# Patient Record
Sex: Female | Born: 1995
Health system: Southern US, Community
[De-identification: ages and names within clinical notes are randomized; demographics above are authoritative.]

## PROBLEM LIST (undated history)

## (undated) DIAGNOSIS — J45909 Unspecified asthma, uncomplicated: Secondary | ICD-10-CM

## (undated) DIAGNOSIS — F329 Major depressive disorder, single episode, unspecified: Secondary | ICD-10-CM

## (undated) DIAGNOSIS — F32A Depression, unspecified: Secondary | ICD-10-CM

---
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
## 2015-02-07 ENCOUNTER — Other Ambulatory Visit (HOSPITAL_COMMUNITY)
Admission: RE | Admit: 2015-02-07 | Discharge: 2015-02-07 | Disposition: A | Discharge status: Home / Self Care | Payer: BLUE CROSS/BLUE SHIELD | Source: Ambulatory Visit | Attending: Family Medicine | Admitting: Family Medicine

## 2015-02-07 DIAGNOSIS — Z113 Encounter for screening for infections with a predominantly sexual mode of transmission: Secondary | ICD-10-CM | POA: Diagnosis present

## 2015-02-07 DIAGNOSIS — Z01411 Encounter for gynecological examination (general) (routine) with abnormal findings: Secondary | ICD-10-CM | POA: Insufficient documentation

## 2016-01-24 DIAGNOSIS — N3010 Interstitial cystitis (chronic) without hematuria: Secondary | ICD-10-CM | POA: Diagnosis not present

## 2016-01-24 DIAGNOSIS — Z01411 Encounter for gynecological examination (general) (routine) with abnormal findings: Secondary | ICD-10-CM | POA: Diagnosis not present

## 2016-01-24 DIAGNOSIS — Z113 Encounter for screening for infections with a predominantly sexual mode of transmission: Secondary | ICD-10-CM | POA: Diagnosis not present

## 2016-04-10 DIAGNOSIS — Z30014 Encounter for initial prescription of intrauterine contraceptive device: Secondary | ICD-10-CM | POA: Diagnosis not present

## 2016-04-10 DIAGNOSIS — N3010 Interstitial cystitis (chronic) without hematuria: Secondary | ICD-10-CM | POA: Diagnosis not present

## 2016-04-10 DIAGNOSIS — Z975 Presence of (intrauterine) contraceptive device: Secondary | ICD-10-CM | POA: Diagnosis not present

## 2016-04-16 DIAGNOSIS — Z3202 Encounter for pregnancy test, result negative: Secondary | ICD-10-CM | POA: Diagnosis not present

## 2016-04-16 DIAGNOSIS — Z3046 Encounter for surveillance of implantable subdermal contraceptive: Secondary | ICD-10-CM | POA: Diagnosis not present

## 2016-04-16 DIAGNOSIS — Z30430 Encounter for insertion of intrauterine contraceptive device: Secondary | ICD-10-CM | POA: Diagnosis not present

## 2016-05-22 DIAGNOSIS — Z30431 Encounter for routine checking of intrauterine contraceptive device: Secondary | ICD-10-CM | POA: Diagnosis not present

## 2016-05-22 DIAGNOSIS — N3010 Interstitial cystitis (chronic) without hematuria: Secondary | ICD-10-CM | POA: Diagnosis not present

## 2016-05-22 DIAGNOSIS — R300 Dysuria: Secondary | ICD-10-CM | POA: Diagnosis not present

## 2016-05-22 DIAGNOSIS — B009 Herpesviral infection, unspecified: Secondary | ICD-10-CM | POA: Diagnosis not present

## 2016-08-19 DIAGNOSIS — J069 Acute upper respiratory infection, unspecified: Secondary | ICD-10-CM | POA: Diagnosis not present

## 2016-08-19 DIAGNOSIS — J029 Acute pharyngitis, unspecified: Secondary | ICD-10-CM | POA: Diagnosis not present

## 2016-09-15 DIAGNOSIS — F329 Major depressive disorder, single episode, unspecified: Secondary | ICD-10-CM | POA: Diagnosis not present

## 2016-09-16 DIAGNOSIS — F329 Major depressive disorder, single episode, unspecified: Secondary | ICD-10-CM | POA: Diagnosis not present

## 2016-09-18 DIAGNOSIS — F321 Major depressive disorder, single episode, moderate: Secondary | ICD-10-CM | POA: Diagnosis not present

## 2016-09-18 DIAGNOSIS — F4310 Post-traumatic stress disorder, unspecified: Secondary | ICD-10-CM | POA: Diagnosis not present

## 2016-09-25 DIAGNOSIS — F329 Major depressive disorder, single episode, unspecified: Secondary | ICD-10-CM | POA: Diagnosis not present

## 2016-10-02 DIAGNOSIS — F4310 Post-traumatic stress disorder, unspecified: Secondary | ICD-10-CM | POA: Diagnosis not present

## 2016-10-02 DIAGNOSIS — F321 Major depressive disorder, single episode, moderate: Secondary | ICD-10-CM | POA: Diagnosis not present

## 2016-10-08 DIAGNOSIS — F329 Major depressive disorder, single episode, unspecified: Secondary | ICD-10-CM | POA: Diagnosis not present

## 2016-10-15 ENCOUNTER — Emergency Department (HOSPITAL_COMMUNITY)
Admission: EM | Admit: 2016-10-15 | Discharge: 2016-10-15 | Disposition: A | Discharge status: Other Acute Inpatient Hospital | Payer: BLUE CROSS/BLUE SHIELD | Attending: Physician Assistant | Admitting: Physician Assistant

## 2016-10-15 ENCOUNTER — Inpatient Hospital Stay (HOSPITAL_COMMUNITY)
Admission: AD | Admit: 2016-10-15 | Discharge: 2016-10-18 | DRG: 885 | Disposition: A | Discharge status: Home / Self Care | Payer: BLUE CROSS/BLUE SHIELD | Source: Intra-hospital | Attending: Psychiatry | Admitting: Psychiatry

## 2016-10-15 ENCOUNTER — Encounter (HOSPITAL_COMMUNITY): Payer: Self-pay | Admitting: Emergency Medicine

## 2016-10-15 DIAGNOSIS — Z5181 Encounter for therapeutic drug level monitoring: Secondary | ICD-10-CM | POA: Insufficient documentation

## 2016-10-15 DIAGNOSIS — F411 Generalized anxiety disorder: Secondary | ICD-10-CM | POA: Diagnosis present

## 2016-10-15 DIAGNOSIS — G4700 Insomnia, unspecified: Secondary | ICD-10-CM | POA: Diagnosis not present

## 2016-10-15 DIAGNOSIS — T1491XA Suicide attempt, initial encounter: Secondary | ICD-10-CM | POA: Diagnosis not present

## 2016-10-15 DIAGNOSIS — Z91410 Personal history of adult physical and sexual abuse: Secondary | ICD-10-CM | POA: Diagnosis not present

## 2016-10-15 DIAGNOSIS — Z79899 Other long term (current) drug therapy: Secondary | ICD-10-CM | POA: Diagnosis not present

## 2016-10-15 DIAGNOSIS — T50902A Poisoning by unspecified drugs, medicaments and biological substances, intentional self-harm, initial encounter: Secondary | ICD-10-CM

## 2016-10-15 DIAGNOSIS — Z91411 Personal history of adult psychological abuse: Secondary | ICD-10-CM

## 2016-10-15 DIAGNOSIS — F339 Major depressive disorder, recurrent, unspecified: Principal | ICD-10-CM | POA: Diagnosis present

## 2016-10-15 DIAGNOSIS — F4310 Post-traumatic stress disorder, unspecified: Secondary | ICD-10-CM | POA: Diagnosis present

## 2016-10-15 DIAGNOSIS — R4182 Altered mental status, unspecified: Secondary | ICD-10-CM | POA: Insufficient documentation

## 2016-10-15 DIAGNOSIS — X789XXA Intentional self-harm by unspecified sharp object, initial encounter: Secondary | ICD-10-CM | POA: Diagnosis not present

## 2016-10-15 DIAGNOSIS — T450X2A Poisoning by antiallergic and antiemetic drugs, intentional self-harm, initial encounter: Secondary | ICD-10-CM | POA: Insufficient documentation

## 2016-10-15 DIAGNOSIS — F322 Major depressive disorder, single episode, severe without psychotic features: Secondary | ICD-10-CM | POA: Diagnosis not present

## 2016-10-15 DIAGNOSIS — F919 Conduct disorder, unspecified: Secondary | ICD-10-CM | POA: Diagnosis not present

## 2016-10-15 DIAGNOSIS — R000 Tachycardia, unspecified: Secondary | ICD-10-CM | POA: Diagnosis not present

## 2016-10-15 DIAGNOSIS — F329 Major depressive disorder, single episode, unspecified: Secondary | ICD-10-CM | POA: Diagnosis not present

## 2016-10-15 DIAGNOSIS — F332 Major depressive disorder, recurrent severe without psychotic features: Secondary | ICD-10-CM | POA: Diagnosis not present

## 2016-10-15 LAB — URINALYSIS, ROUTINE W REFLEX MICROSCOPIC
Bilirubin Urine: NEGATIVE
Glucose, UA: NEGATIVE mg/dL
Ketones, ur: NEGATIVE mg/dL
Nitrite: NEGATIVE
PROTEIN: NEGATIVE mg/dL
Specific Gravity, Urine: 1.008 (ref 1.005–1.030)
pH: 5 (ref 5.0–8.0)

## 2016-10-15 LAB — CBC
HCT: 46.2 % — ABNORMAL HIGH (ref 36.0–46.0)
Hemoglobin: 15.9 g/dL — ABNORMAL HIGH (ref 12.0–15.0)
MCH: 28.3 pg (ref 26.0–34.0)
MCHC: 34.4 g/dL (ref 30.0–36.0)
MCV: 82.2 fL (ref 78.0–100.0)
PLATELETS: 386 10*3/uL (ref 150–400)
RBC: 5.62 MIL/uL — ABNORMAL HIGH (ref 3.87–5.11)
RDW: 12.9 % (ref 11.5–15.5)
WBC: 11.1 10*3/uL — ABNORMAL HIGH (ref 4.0–10.5)

## 2016-10-15 LAB — LIPASE, BLOOD: Lipase: 19 U/L (ref 11–51)

## 2016-10-15 LAB — PREGNANCY, URINE: Preg Test, Ur: NEGATIVE

## 2016-10-15 LAB — COMPREHENSIVE METABOLIC PANEL
ALK PHOS: 59 U/L (ref 38–126)
ALT: 22 U/L (ref 14–54)
ANION GAP: 10 (ref 5–15)
AST: 24 U/L (ref 15–41)
Albumin: 5.2 g/dL — ABNORMAL HIGH (ref 3.5–5.0)
BILIRUBIN TOTAL: 0.5 mg/dL (ref 0.3–1.2)
BUN: 11 mg/dL (ref 6–20)
CO2: 24 mmol/L (ref 22–32)
Calcium: 9.8 mg/dL (ref 8.9–10.3)
Chloride: 104 mmol/L (ref 101–111)
Creatinine, Ser: 0.86 mg/dL (ref 0.44–1.00)
GFR calc non Af Amer: >60 mL/min (ref >60)
Glucose, Bld: 108 mg/dL — ABNORMAL HIGH (ref 65–99)
Potassium: 3.9 mmol/L (ref 3.5–5.1)
SODIUM: 138 mmol/L (ref 135–145)
TOTAL PROTEIN: 9 g/dL — AB (ref 6.5–8.1)

## 2016-10-15 LAB — RAPID URINE DRUG SCREEN, HOSP PERFORMED
Amphetamines: NOT DETECTED
Barbiturates: NOT DETECTED
Benzodiazepines: NOT DETECTED
Cocaine: NOT DETECTED
OPIATES: NOT DETECTED
Tetrahydrocannabinol: NOT DETECTED

## 2016-10-15 LAB — PROTIME-INR
INR: 0.99
Prothrombin Time: 13.1 seconds (ref 11.4–15.2)

## 2016-10-15 LAB — SALICYLATE LEVEL

## 2016-10-15 LAB — I-STAT CG4 LACTIC ACID, ED: Lactic Acid, Venous: 1.56 mmol/L (ref 0.5–1.9)

## 2016-10-15 LAB — I-STAT TROPONIN, ED: TROPONIN I, POC: 0 ng/mL (ref 0.00–0.08)

## 2016-10-15 LAB — MAGNESIUM: Magnesium: 1.8 mg/dL (ref 1.7–2.4)

## 2016-10-15 LAB — ETHANOL

## 2016-10-15 LAB — ACETAMINOPHEN LEVEL

## 2016-10-15 LAB — CBG MONITORING, ED: Glucose-Capillary: 96 mg/dL (ref 65–99)

## 2016-10-15 MED ORDER — ACETAMINOPHEN 325 MG PO TABS: 650.0000 mg | ORAL_TABLET | ORAL | Status: DC | PRN

## 2016-10-15 MED ORDER — IBUPROFEN 200 MG PO TABS
600.0000 mg | ORAL_TABLET | Freq: Three times a day (TID) | ORAL | Status: DC | PRN
Start: 2016-10-15 — End: 2016-10-16

## 2016-10-15 MED ORDER — SODIUM CHLORIDE 0.9 % IV BOLUS (SEPSIS)
1000.0000 mL | Freq: Once | INTRAVENOUS | Status: AC
  Administered 2016-10-15: 1000 mL via INTRAVENOUS

## 2016-10-15 MED ORDER — LORAZEPAM 1 MG PO TABS: 1.0000 mg | ORAL_TABLET | Freq: Three times a day (TID) | ORAL | Status: DC | PRN

## 2016-10-15 MED ORDER — ONDANSETRON HCL 4 MG PO TABS
4.0000 mg | ORAL_TABLET | Freq: Three times a day (TID) | ORAL | Status: DC | PRN
Start: 2016-10-15 — End: 2016-10-16

## 2016-10-15 NOTE — ED Notes (Signed)
Patient denies SI, HI and AVH. Plan of care discussed. Encouragement and support provided and safety maintain. Q 15 min safety check in place and video monitoring.

## 2016-10-15 NOTE — ED Provider Notes (Signed)
[facility]-EMERGENCY DEPT Provider Note   CSN: [id number] Arrival date & time: 10/15/16  1427     History   Chief Complaint Chief Complaint  Patient presents with  . Drug Overdose    HPI Charlotte Sanes is a 21 y.o. female.  HPI   Patient is a 21 year old female presenting with overdose. Patient had her boyfriend break up with her last night. This morning at noon she took 10-15 sleep aid pills. Patient's family brought in the bottle and there only active component is diphenhydramine 50 mg. She took these at noon. Patient went to go see her psychologist today. Patient's psychologist referred her to the emergency Department immediately.  Patient reports that she just wanted to sleep. Patient is altered at this time.  History reviewed. No pertinent past medical history.  There are no active problems to display for this patient.   History reviewed. No pertinent surgical history.  OB History    No data available       Home Medications    Prior to Admission medications   Not on File    Family History No family history on file.  Social History Social History  Substance Use Topics  . Smoking status: Not on file  . Smokeless tobacco: Not on file  . Alcohol use Not on file     Allergies   Patient has no known allergies.   Review of Systems Review of Systems  Unable to perform ROS: Mental status change  Psychiatric/Behavioral: Positive for behavioral problems, self-injury and suicidal ideas.     Physical Exam Updated Vital Signs BP 133/79   Pulse (!) 162   Temp 98.8 F (37.1 C) (Oral)   Resp 24   Ht 5\' 6"  (1.676 m)   Wt 196 lb 14.4 oz (89.3 kg)   LMP 10/05/2016   SpO2 100%   BMI 31.78 kg/m   Physical Exam  Constitutional: She is oriented to person, place, and time. She appears well-developed and well-nourished.  HENT:  Head: Normocephalic and atraumatic.  Eyes: Right eye exhibits no discharge.  Cardiovascular: Regular rhythm.   Tachycardia.    Pulmonary/Chest: Effort normal and breath sounds normal. No respiratory distress. She has no wheezes.  Abdominal: Soft. She exhibits no distension. There is no tenderness.  Neurological: She is oriented to person, place, and time.  Skin: Skin is warm and dry. She is not diaphoretic.  Psychiatric: She has a normal mood and affect.  Nursing note and vitals reviewed.    ED Treatments / Results  Labs (all labs ordered are listed, but only abnormal results are displayed) Labs Reviewed  URINE CULTURE  COMPREHENSIVE METABOLIC PANEL  ETHANOL  SALICYLATE LEVEL  ACETAMINOPHEN LEVEL  CBC  RAPID URINE DRUG SCREEN, HOSP PERFORMED  PROTIME-INR  URINALYSIS, ROUTINE W REFLEX MICROSCOPIC  PREGNANCY, URINE  LIPASE, BLOOD  CBG MONITORING, ED  I-STAT CG4 LACTIC ACID, ED  I-STAT TROPOININ, ED    EKG  EKG Interpretation None       Radiology No results found.  Procedures Procedures (including critical care time)  Medications Ordered in ED Medications  sodium chloride 0.9 % bolus 1,000 mL (not administered)     Initial Impression / Assessment and Plan / ED Course  I have reviewed the triage vital signs and the nursing notes.  Pertinent labs & imaging results that were available during my care of the patient were reviewed by me and considered in my medical decision making (see chart for details).     Patient is  a 21 year old female presenting with drug overdose. Since boyfriend broke up with her last night. She was feeling sad today and took 10-15 sleep aids. Only active ingredient diphenhydramine. Denies any other drug ingestion however patient is altered at this time.  Initially patient's QT was widened Angela NevinMagen looks lites confirmed nor normal. 2 L of normal saline given.  7:49 PM Patient now 7 hours after ingestion and QTC has normalized. We'll consult TTS for admission to inpatient psychiatric facility.  CRITICAL CARE Performed by: Arlana Hoveourteney L Matalie Romberger Total critical care  time: 45 minutes Critical care time was exclusive of separately billable procedures and treating other patients. Critical care was necessary to treat or prevent imminent or life-threatening deterioration. Critical care was time spent personally by me on the following activities: development of treatment plan with patient and/or surrogate as well as nursing, discussions with consultants, evaluation of patient's response to treatment, examination of patient, obtaining history from patient or surrogate, ordering and performing treatments and interventions, ordering and review of laboratory studies, ordering and review of radiographic studies, pulse oximetry and re-evaluation of patient's condition.   Final Clinical Impressions(s) / ED Diagnoses   Final diagnoses:  None    New Prescriptions New Prescriptions   No medications on file     Olufemi Mofield Randall AnLyn Garron Eline, MD 10/15/16 1950

## 2016-10-15 NOTE — ED Notes (Signed)
Pt [facility] by [facility] transportation service for continuation of specialized care. Patient has no belongings. Belongings sheet signed. Pt left in no acute distress.
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
## 2016-10-15 NOTE — ED Notes (Signed)
Patient and family have established the password of [phone] with each other.

## 2016-10-15 NOTE — ED Notes (Signed)
Pt cannot use restroom at this time, aware urine specimen is needed.  

## 2016-10-15 NOTE — ED Notes (Signed)
Spoke with Lawson FiscalLori [facility] [employer] [facility]-patient has been cleared

## 2016-10-15 NOTE — ED Notes (Signed)
Spoke with Rashell-ready for patient

## 2016-10-15 NOTE — ED Notes (Signed)
Patient educated about search process and term "contraband " and routine search performed. No contraband found. 

## 2016-10-15 NOTE — B[facility] Counselor (Signed)
[facility] by Sel[facility] BattenKim, [facility]1. Attending Dr. Jama Flavorsobos. Admitting: Nira ConnJason Berry, NP. Selena BattenKim, [facility] reported, pt can come now. Updated disposition discussed with Rashell, RN.   Gwinda Passereylese D Bennett, MS, [facility], [facility] Triage Specialist [phone]

## 2016-10-15 NOTE — ED Provider Notes (Signed)
11 PM patient alert ambulatory Glasgow Coma Score 15. Cooperative. Stable [facility]. Accepting physician Dr.Cobos Results for orders placed or performed during the hospital encounter of 10/15/16  Comprehensive metabolic panel  Result Value Ref Range   Sodium 138 135 - 145 mmol/L   Potassium 3.9 3.5 - 5.1 mmol/L   Chloride 104 101 - 111 mmol/L   CO2 24 22 - 32 mmol/L   Glucose, Bld 108 (H) 65 - 99 mg/dL   BUN 11 6 - 20 mg/dL   Creatinine, Ser 1.610.86 0.44 - 1.00 mg/dL   Calcium 9.8 8.9 - 09.610.3 mg/dL   Total Protein 9.0 (H) 6.5 - 8.1 g/dL   Albumin 5.2 (H) 3.5 - 5.0 g/dL   AST 24 15 - 41 U/L   ALT 22 14 - 54 U/L   Alkaline Phosphatase 59 38 - 126 U/L   Total Bilirubin 0.5 0.3 - 1.2 mg/dL   GFR calc non Af Amer >60 >60 mL/min   GFR calc Af Amer >60 >60 mL/min   Anion gap 10 5 - 15  Ethanol  Result Value Ref Range   Alcohol, Ethyl (B) <5 <5 mg/dL  Salicylate level  Result Value Ref Range   Salicylate Lvl <7.0 2.8 - 30.0 mg/dL  Acetaminophen level  Result Value Ref Range   Acetaminophen (Tylenol), Serum <10 (L) 10 - 30 ug/mL  cbc  Result Value Ref Range   WBC 11.1 (H) 4.0 - 10.5 K/uL   RBC 5.62 (H) 3.87 - 5.11 MIL/uL   Hemoglobin 15.9 (H) 12.0 - 15.0 g/dL   HCT 04.546.2 (H) 40.936.0 - 81.146.0 %   MCV 82.2 78.0 - 100.0 fL   MCH 28.3 26.0 - 34.0 pg   MCHC 34.4 30.0 - 36.0 g/dL   RDW 91.412.9 78.211.5 - 95.615.5 %   Platelets 386 150 - 400 K/uL  Rapid urine drug screen (hospital performed)  Result Value Ref Range   Opiates NONE DETECTED NONE DETECTED   Cocaine NONE DETECTED NONE DETECTED   Benzodiazepines NONE DETECTED NONE DETECTED   Amphetamines NONE DETECTED NONE DETECTED   Tetrahydrocannabinol NONE DETECTED NONE DETECTED   Barbiturates NONE DETECTED NONE DETECTED  Protime-INR  Result Value Ref Range   Prothrombin Time 13.1 11.4 - 15.2 seconds   INR 0.99   Urinalysis, Routine w reflex microscopic  Result Value Ref Range   Color, Urine STRAW (A) YELLOW   APPearance CLEAR CLEAR   Specific Gravity, Urine 1.008 1.005 - 1.030   pH 5.0 5.0 - 8.0   Glucose, UA NEGATIVE NEGATIVE mg/dL   Hgb urine dipstick SMALL (A) NEGATIVE   Bilirubin Urine NEGATIVE NEGATIVE   Ketones, ur NEGATIVE NEGATIVE mg/dL   Protein, ur NEGATIVE NEGATIVE mg/dL   Nitrite NEGATIVE NEGATIVE   Leukocytes, UA MODERATE (A) NEGATIVE   RBC / HPF 0-5 0 - 5 RBC/hpf   WBC, UA 6-30 0 - 5 WBC/hpf   Bacteria, UA RARE (A) NONE SEEN   Squamous Epithelial / LPF 0-5 (A) NONE SEEN   Mucous PRESENT   Pregnancy, urine  Result Value Ref Range   Preg Test, Ur NEGATIVE NEGATIVE  Lipase, blood  Result Value Ref Range   Lipase 19 11 - 51 U/L  Magnesium  Result Value Ref Range   Magnesium 1.8 1.7 - 2.4 mg/dL  CBG monitoring, ED  Result Value Ref Range   Glucose-Capillary 96 65 - 99 mg/dL  I-Stat CG4 Lactic Acid, ED  Result Value Ref Range  Lactic Acid, Venous 1.56 0.5 - 1.9 mmol/L  I-stat troponin, ED  Result Value Ref Range   Troponin i, poc 0.00 0.00 - 0.08 ng/mL   Comment 3           No results found.   Doug Sou, MD 10/15/16 2314

## 2016-10-15 NOTE — ED Triage Notes (Addendum)
Pt form her therapist's office. Pt had a bad day and took 10-15  50 mg benadryl pills and made superficial cuts to bilateral wrists. Pt took the pills around noon. Pt has no complaints at this time. Pt is lethargic and tachycardic. Parents are at bedside  Per PC pt can have cholinergic effects such as seizures and QRS changes. If QRS is over 120 give bicarb. If pt has agitation or cholinergic symproms give benzos. They suggest CMP and salicycilate level and 4 hour post tylenol . Monitor for 6 hours post ingestion

## 2016-10-15 NOTE — ED Notes (Signed)
Spoke with poison control. PC advised to observe patient for tachycardia, hypertension, flushing, delusions, hallucinations and seizures. PC recommended IV fluids, benzos for treatment of agitation / seizure. PC also recommended optimizing electrolytes and keeping K and Mg on high side of normal due to risk of Torsades de pointes. PC stated that pts QTc >500 was a concern. Monitor patient and give IV fluids. Dr. Corlis LeakMackuen notified of PC recommendations.

## 2016-10-16 ENCOUNTER — Encounter (HOSPITAL_COMMUNITY): Payer: Self-pay

## 2016-10-16 ENCOUNTER — Encounter (HOSPITAL_COMMUNITY): Payer: Self-pay | Admitting: Psychiatry

## 2016-10-16 DIAGNOSIS — Z79899 Other long term (current) drug therapy: Secondary | ICD-10-CM

## 2016-10-16 DIAGNOSIS — F4310 Post-traumatic stress disorder, unspecified: Secondary | ICD-10-CM

## 2016-10-16 DIAGNOSIS — T1491XA Suicide attempt, initial encounter: Secondary | ICD-10-CM

## 2016-10-16 DIAGNOSIS — F332 Major depressive disorder, recurrent severe without psychotic features: Secondary | ICD-10-CM

## 2016-10-16 DIAGNOSIS — X789XXA Intentional self-harm by unspecified sharp object, initial encounter: Secondary | ICD-10-CM

## 2016-10-16 DIAGNOSIS — T450X2A Poisoning by antiallergic and antiemetic drugs, intentional self-harm, initial encounter: Secondary | ICD-10-CM

## 2016-10-16 LAB — URINALYSIS, ROUTINE W REFLEX MICROSCOPIC
Bilirubin Urine: NEGATIVE
GLUCOSE, UA: NEGATIVE mg/dL
Hgb urine dipstick: NEGATIVE
KETONES UR: NEGATIVE mg/dL
NITRITE: NEGATIVE
PH: 5 (ref 5.0–8.0)
Protein, ur: NEGATIVE mg/dL
SPECIFIC GRAVITY, URINE: 1.024 (ref 1.005–1.030)

## 2016-10-16 LAB — URINE CULTURE

## 2016-10-16 LAB — BASIC METABOLIC PANEL
Anion gap: 8 (ref 5–15)
BUN: 20 mg/dL (ref 6–20)
CALCIUM: 9.5 mg/dL (ref 8.9–10.3)
CO2: 28 mmol/L (ref 22–32)
Chloride: 103 mmol/L (ref 101–111)
Creatinine, Ser: 0.86 mg/dL (ref 0.44–1.00)
GFR calc non Af Amer: >60 mL/min (ref >60)
Glucose, Bld: 121 mg/dL — ABNORMAL HIGH (ref 65–99)
Potassium: 3.7 mmol/L (ref 3.5–5.1)
Sodium: 139 mmol/L (ref 135–145)

## 2016-10-16 LAB — CBC WITH DIFFERENTIAL/PLATELET
BASOS ABS: 0 10*3/uL (ref 0.0–0.1)
Basophils Relative: 0 %
EOS ABS: 0.2 10*3/uL (ref 0.0–0.7)
EOS PCT: 2 %
HCT: 39 % (ref 36.0–46.0)
Hemoglobin: 12.8 g/dL (ref 12.0–15.0)
Lymphocytes Relative: 38 %
Lymphs Abs: 3.1 10*3/uL (ref 0.7–4.0)
MCH: 27.6 pg (ref 26.0–34.0)
MCHC: 32.8 g/dL (ref 30.0–36.0)
MCV: 84.1 fL (ref 78.0–100.0)
MONO ABS: 0.5 10*3/uL (ref 0.1–1.0)
Monocytes Relative: 6 %
Neutro Abs: 4.5 10*3/uL (ref 1.7–7.7)
Neutrophils Relative %: 54 %
PLATELETS: 336 10*3/uL (ref 150–400)
RBC: 4.64 MIL/uL (ref 3.87–5.11)
RDW: 13.3 % (ref 11.5–15.5)
WBC: 8.2 10*3/uL (ref 4.0–10.5)

## 2016-10-16 MED ORDER — MAGNESIUM HYDROXIDE 400 MG/5ML PO SUSP: 5.0000 mL | Freq: Every day | ORAL | Status: DC | PRN

## 2016-10-16 MED ORDER — TRAZODONE HCL 50 MG PO TABS
50.0000 mg | ORAL_TABLET | Freq: Every evening | ORAL | Status: DC | PRN
  Administered 2016-10-16 – 2016-10-17 (×2): 50 mg via ORAL
  Filled 2016-10-16 (×2): qty 1

## 2016-10-16 MED ORDER — SERTRALINE HCL 100 MG PO TABS
100.0000 mg | ORAL_TABLET | Freq: Every day | ORAL | Status: DC
  Administered 2016-10-16 – 2016-10-17 (×2): 100 mg via ORAL
  Filled 2016-10-16 (×4): qty 1

## 2016-10-16 MED ORDER — HYDROXYZINE HCL 25 MG PO TABS
25.0000 mg | ORAL_TABLET | Freq: Four times a day (QID) | ORAL | Status: DC | PRN
  Administered 2016-10-16 – 2016-10-17 (×2): 25 mg via ORAL
  Filled 2016-10-16 (×2): qty 1

## 2016-10-16 MED ORDER — ACETAMINOPHEN 325 MG PO TABS: 650.0000 mg | ORAL_TABLET | Freq: Four times a day (QID) | ORAL | Status: DC | PRN

## 2016-10-16 NOTE — Progress Notes (Signed)
Recreation Therapy Notes  Date: 10/16/16 Time: 0930 Location: 300 Hall Dayroom  Group Topic: Stress Management  Goal Area(s) Addresses:  Patient will verbalize importance of using healthy stress management.  Patient will identify positive emotions associated with healthy stress management.   Intervention: Stress Management  Activity :  Peaceful Place.  LRT introduced the stress management technique of guided imagery.  LRT read a script to allow patients to engage in a "mental vacation".  Patients were to follow along as LRT read script.  Education:  Stress Management, Discharge Planning.   Education Outcome: Acknowledges edcuation/In group clarification offered/Needs additional education  Clinical Observations/Feedback: Pt did not attend group.   Caroll RancherMarjette Claudio Mondry, LRT/CTRS         Caroll RancherLindsay, Hertha Gergen A 10/16/2016 12:14 PM

## 2016-10-16 NOTE — Progress Notes (Signed)
D    Pt interacts well with others and is compliant with treatment   She is somewhat superficial and tends to minimize her situation of being in the hospital    She does report improvement in her mood and feelings   A    Verbal support given   Medications administered and effectiveness monitored    Q 15 min checks R    Pt is safe at present time and somewhat receptive to verbal support

## 2016-10-16 NOTE — Progress Notes (Signed)
Samantha Ross is a 21 year old female being admitted voluntarily to [facility] from [facility].  She came to the ED after attempting to cut wrist and she OD'd on some sleeping pills after break up with boyfriend.  She denied, HI and AVH. She reported sadness, crying, excessive guilt, feeling hopeless/worthless, isolating, decreased sleep.  She is diagnosed with Major Depressive Disorder, Recurrent, Severe without Psychotic Features.  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   She denies any medical issues and appears to be in no physical distress.  Oriented her to the unit.  Admission paperwork completed and signed.  Belongings searched and no belongings needed to be locked up.  Skin assessment completed and noted self inflicted lacerations to left/right forearm.  Left arm required two steri-strips.  Dressing changed with no drainage or bleeding noted.  Q 15 minute checks initiated for safety.  We will monitor the progress towards her goals.
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
## 2016-10-16 NOTE — Tx Team (Signed)
Interdisciplinary Treatment and Diagnostic Plan Update  10/16/2016 Time of Session: 2:21 PM  Samantha Ross MRN: [id number]  Principal Diagnosis: Suicidal ideation/overdose  Secondary Diagnoses: Active Problems:   * No active hospital problems. *   Current Medications:  Current Facility-Administered Medications  Medication Dose Route Frequency Provider Last Rate Last Dose  . acetaminophen (TYLENOL) tablet 650 mg  650 mg Oral Q6H PRN Sharma Covert, MD      . hydrOXYzine (ATARAX/VISTARIL) tablet 25 mg  25 mg Oral Q6H PRN Sharma Covert, MD      . magnesium hydroxide (MILK OF MAGNESIA) suspension 5 mL  5 mL Oral Daily PRN Sharma Covert, MD      . sertraline (ZOLOFT) tablet 100 mg  100 mg Oral Daily Sharma Covert, MD   100 mg at 10/16/16 1204  . traZODone (DESYREL) tablet 50 mg  50 mg Oral QHS PRN Sharma Covert, MD        PTA Medications: Prescriptions Prior to Admission  Medication Sig Dispense Refill Last Dose  . Diphenhydramine-APAP, sleep, (APAP-DIPHENHYDRAMINE PO) Take 10-15 tablets by mouth once.   10/15/2016 at Unknown time  . sertraline (ZOLOFT) 50 MG tablet Take 50 mg by mouth daily. Patient takes in AM   10/15/2016 at Unknown time    Treatment Modalities: Medication Management, Group therapy, Case management,  1 to 1 session with clinician, Psychoeducation, Recreational therapy.  Patient Stressors: Educational concerns Loss of boyfriend, break up 10/15/16  Patient Strengths: Curator fund of knowledge Physical Health Supportive family/friends  Physician Treatment Plan for Primary Diagnosis: Suicidal ideation/overdose Long Term Goal(s): Improvement in symptoms so as ready for discharge  Short Term Goals: Ability to identify changes in lifestyle to reduce recurrence of condition will improve Ability to verbalize feelings will improve Ability to disclose and discuss suicidal ideas Ability to demonstrate self-control will improve Ability  to identify and develop effective coping behaviors will improve Ability to maintain clinical measurements within normal limits will improve Compliance with prescribed medications will improve Ability to identify changes in lifestyle to reduce recurrence of condition will improve Ability to verbalize feelings will improve Ability to disclose and discuss suicidal ideas Ability to demonstrate self-control will improve Ability to identify and develop effective coping behaviors will improve Ability to maintain clinical measurements within normal limits will improve Compliance with prescribed medications will improve  Medication Management: Evaluate patient's response, side effects, and tolerance of medication regimen.  Therapeutic Interventions: 1 to 1 sessions, Unit Group sessions and Medication administration.  Evaluation of Outcomes: Not Met   RN Treatment Plan for Primary Diagnosis: Suicidal ideation/overdose Long Term Goal(s): Knowledge of disease and therapeutic regimen to maintain health will improve  Short Term Goals: Ability to verbalize feelings will improve, Ability to disclose and discuss suicidal ideas and Ability to identify and develop effective coping behaviors will improve  Medication Management: RN will administer medications as ordered by provider, will assess and evaluate patient's response and provide education to patient for prescribed medication. RN will report any adverse and/or side effects to prescribing provider.  Therapeutic Interventions: 1 on 1 counseling sessions, Psychoeducation, Medication administration, Evaluate responses to treatment, Monitor vital signs and CBGs as ordered, Perform/monitor CIWA, COWS, AIMS and Fall Risk screenings as ordered, Perform wound care treatments as ordered.  Evaluation of Outcomes: Not Met   LCSW Treatment Plan for Primary Diagnosis: Suicidal ideation/overdose Long Term Goal(s): Safe transition to appropriate next level of care  at discharge, Engage patient in therapeutic group  addressing interpersonal concerns.  Short Term Goals: Engage patient in aftercare planning with referrals and resources, Identify triggers associated with mental health/substance abuse issues and Increase skills for wellness and recovery  Therapeutic Interventions: Assess for all discharge needs, 1 to 1 time with Social worker, Explore available resources and support systems, Assess for adequacy in community support network, Educate family and significant other(s) on suicide prevention, Complete Psychosocial Assessment, Interpersonal group therapy.  Evaluation of Outcomes: Not Met   Progress in Treatment: Attending groups: Pt is new to milieu, continuing to assess  Participating in groups: Pt is new to milieu, continuing to assess  Taking medication as prescribed: Yes, MD continues to assess for medication changes as needed Toleration medication: Yes, no side effects reported at this time Family/Significant other contact made: No, Pt declines Patient understands diagnosis: Continuing to assess Discussing patient identified problems/goals with staff: Yes Medical problems stabilized or resolved: Yes Denies suicidal/homicidal ideation: No, recently admitted with SI Issues/concerns per patient self-inventory: None Other: N/A  New problem(s) identified: None identified at this time.   New Short Term/Long Term Goal(s): None identified at this time.   Discharge Plan or Barriers: Pt will return home and follow-up with outpatient services.   Reason for Continuation of Hospitalization: Anxiety Depression Medication stabilization Suicidal ideation  Estimated Length of Stay: 3-5 days  Attendees: Patient: 10/16/2016  2:21 PM  Physician: Dr. Mallie Darting 10/16/2016  2:21 PM  Nursing: Ronnette Hila D., RN 10/16/2016  2:21 PM  RN Care Manager: Lars Pinks, RN 10/16/2016  2:21 PM  Social Worker: Adriana Reams, LCSW; [place], LCSW 10/16/2016  2:21  PM  Recreational Therapist:  10/16/2016  2:21 PM  Other: Lindell Spar, NP 10/16/2016  2:21 PM  Other:  10/16/2016  2:21 PM  Other: 10/16/2016  2:21 PM    Scribe for Treatment Team: Gladstone Lighter, LCSW 10/16/2016 2:21 PM

## 2016-10-16 NOTE — [facility] Suicide Risk Assessment (Signed)
[facility] INPATIENT:  Family/Significant Other Suicide Prevention Education  Suicide Prevention Education:  Patient Refusal for Family/Significant Other Suicide Prevention Education: The patient Samantha Ross has refused to provide written consent for family/significant other to be provided Family/Significant Other Suicide Prevention Education during admission and/or prior to discharge.  Physician notified.  Verdene LennertLauren C Burke Terry 10/16/2016, 2:21 PM

## 2016-10-16 NOTE — [facility] Group Notes (Signed)
[facility] LCSW Group Therapy 10/16/2016 1:15pm  Type of Therapy: Group Therapy- Feelings Around Relapse and Recovery  Participation Level: Minimal  Participation Quality:  Appropriate  Affect:  Flat  Cognitive: Alert and Oriented   Insight:  Unable to Assess  Engagement in Therapy: Limited  Modes of Intervention: Clarification, Confrontation, Discussion, Education, Exploration, Limit-setting, Orientation, Problem-solving, Rapport Building, Dance movement psychotherapisteality Testing, Socialization and Support  Summary of Progress/Problems: The topic for today was feelings about relapse. The group discussed what relapse prevention is to them and identified triggers that they are on the path to relapse. Members also processed their feeling towards relapse and were able to relate to common experiences. Group also discussed coping skills that can be used for relapse prevention.  Pt did not participate in group discussion.   Therapeutic Modalities:   Cognitive Behavioral Therapy Solution-Focused Therapy Assertiveness Training Relapse Prevention Therapy    Vernie ShanksLauren Lashay Osborne, LCSW [phone] 10/16/2016 2:25 PM
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
## 2016-10-16 NOTE — Progress Notes (Signed)
Patient ID: Samantha Ross, female   DOB: [birth year], 21 y.o.   MRN: [id number]   Report accepted from admitting nurse Herbert SetaHeather, RN. Pt currently presents with a flat affect and guarded behavior. Pt supported emotionally and encouraged to express concerns and questions. Pt's safety ensured with 15 minute and environmental checks. Pt currently denies SI/HI and A/V hallucinations. Pt in no current distress, currently lying in bed. Will continue POC.

## 2016-10-16 NOTE — Progress Notes (Signed)
Adult Psychoeducational Group Note  Date:  10/16/2016 Time:  10:51 AM  Group Topic/Focus:  Goals Group:   The focus of this group is to help patients establish daily goals to achieve during treatment and discuss how the patient can incorporate goal setting into their daily lives to aide in recovery.  Participation Level:  Active  Participation Quality:  Appropriate  Affect:  Appropriate  Cognitive:  Appropriate  Insight: Appropriate  Engagement in Group:  Engaged  Modes of Intervention:  Discussion  Additional Comments:  Pt stated she was feeling good.  Pt stated she overdosed on sleeping pills.  Pt stated she ill contact therapist and mediate in order to keep from relapsing.  [name], Samatha Anspach D 10/16/2016, 10:51 AM

## 2016-10-16 NOTE — Tx Team (Signed)
Initial Treatment Plan 10/16/2016 12:45 AM Roderick Edward JollySilva BJY:[id number]    PATIENT STRESSORS: Educational concerns Loss of boyfriend, break up 10/15/16   PATIENT STRENGTHS: Communication skills General fund of knowledge Physical Health Supportive family/friends   PATIENT IDENTIFIED PROBLEMS: Depression  Anxiety   Suicidal ideation/gestures/OD  "To get out early"  "Learn to cope with my depression"             DISCHARGE CRITERIA:  Improved stabilization in mood, thinking, and/or behavior Verbal commitment to aftercare and medication compliance  PRELIMINARY DISCHARGE PLAN: Outpatient therapy Medication management  PATIENT/FAMILY INVOLVEMENT: This treatment plan has been presented to and reviewed with the patient, Deere & CompanyJeniffer Silva.  The patient and family have been given the opportunity to ask questions and make suggestions.  Levin BaconHeather V Jamonte Curfman, RN 10/16/2016, 12:45 AM

## 2016-10-16 NOTE — Progress Notes (Signed)
Samantha Ross is OOB UAL on the 400 hall today...she tolerates this very well. Inititally, she was quite shy, guarded and avoiding contact with staff and this Clinical research associatewriter. After awhile, she approached Clinical research associatewriter, began to ask questions, regarding  400 hall program...etc.She is flat, guarded and unhappy. Concerned about what medications she wiill be prescribed and how long she wil be hospitalized. A She duid completed her daily assessment and on it she wrote she deneid SI today and she rated her depression, hopelessness and anxiety " 3/2/0", respectively. Urine specimen obtained frompt per MD order and sent for lab and pt's zoloft increased to 100mg  per MD order. Safety in place. Pt contniuing to focus on improving distress tolerance as well as practicing new coping skills. Pos reinforcement offered to pt by Clinical research associatewriter. R Safety in place. Pt encuraged to cont to engage in recovery.

## 2016-10-17 DIAGNOSIS — F322 Major depressive disorder, single episode, severe without psychotic features: Secondary | ICD-10-CM

## 2016-10-17 DIAGNOSIS — F329 Major depressive disorder, single episode, unspecified: Secondary | ICD-10-CM | POA: Diagnosis present

## 2016-10-17 MED ORDER — ALUM & MAG HYDROXIDE-SIMETH 200-200-20 MG/5ML PO SUSP: 30.0000 mL | ORAL | Status: DC | PRN

## 2016-10-17 MED ORDER — SERTRALINE HCL 100 MG PO TABS
100.0000 mg | ORAL_TABLET | Freq: Every day | ORAL | Status: DC
  Filled 2016-10-17 (×2): qty 1

## 2016-10-17 MED ORDER — MAGNESIUM HYDROXIDE 400 MG/5ML PO SUSP: 30.0000 mL | Freq: Every day | ORAL | Status: DC | PRN

## 2016-10-17 NOTE — Progress Notes (Signed)
D:Pt is mildly anxious and impulsive. Pt requested medication for constipation and c/o feeling sleepy with increased Zoloft order. Pt is out in the dayroom interacting with her peers. A:Reported symptom to NP and received new orders. Offered support, encouragement and 15 minute checks. R:Pt denies si and hi. Safety maintained on the unit.
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
## 2016-10-17 NOTE — BHH Group Notes (Signed)
Adult Therapy Group Note (Clinical Social Work)  Date:  10/17/2016  Time:  10:00-11:00AM  Group Topic/Focus: Fears and Healthy/Unhealthy Coping Skills  Building Self Esteem:   The Focus of this group was to discuss some of the prevalent fears that patients experience, and to identify the commonalities among group members.  An exercise was used to initiate the discussion, followed by writing on the white board a group-generated list of unhealthy coping and healthy coping techniques to deal with each fear, as well as supports that could help in using healthy coping.  This included a variety of supports, and CSW emphasized professional supports such as therapist, support groups and psychiatrist.  Radical acceptance was discussed briefly.  Participation Level:  Active  Participation Quality:  Appropriate, Attentive, Sharing and Supportive  Affect:  Depressed  Cognitive:  Appropriate  Insight: Improving  Engagement in Group:  Engaged  Modes of Intervention:  Discussion, Exploration and Support  Additional Comments:  The patient expressed that she currently walks for coping purposes, and would like to learn more skills at keeping herself focused on positive thoughts.  She spoke up frequently and with significant forthrightness throughout group.  Ambrose MantleMareida Grossman-Orr, LCSW 10/17/2016   12:35pm

## 2016-10-17 NOTE — Progress Notes (Signed)
Pt attend group. Her day was a 9.5. her goal was not to feel lonely and she did not feel lonely today.

## 2016-10-18 MED ORDER — HYDROXYZINE HCL 25 MG PO TABS: 25.0000 mg | ORAL_TABLET | Freq: Four times a day (QID) | ORAL | 0 refills | Status: AC | PRN

## 2016-10-18 MED ORDER — TRAZODONE HCL 50 MG PO TABS: 50.0000 mg | ORAL_TABLET | Freq: Every evening | ORAL | 0 refills | Status: AC | PRN

## 2016-10-18 MED ORDER — SERTRALINE HCL 100 MG PO TABS: 100.0000 mg | ORAL_TABLET | Freq: Every day | ORAL | 0 refills | Status: AC

## 2016-10-18 NOTE — Progress Notes (Addendum)
  [facility] :  Will you be returning to the same living situation after discharge:  Yes,  with parent At discharge, do you have transportation home?: Yes,  family Do you have the ability to pay for your medications: Yes,  no barriers  Release of information consent forms completed and in the chart;  Patient's signature needed at discharge.  Patient to Follow up at: Follow-up Information    [facility] [place] Follow up on 11/13/2016.   Why:  at 11:00am for medication management with Dr. Carolynne EdouardKoitala Contact information: [address] [place]  [id number] [phone]       Dr. Doran HeaterMargaret Barnes. Go on 10/20/2016.   Why:  at 1:00pm for therapy.  Contact information: [address] [place]  [id number] [phone]          Next level of care provider [facility] Link:no  Safety Planning and Suicide Prevention discussed: No.  Declined by patient  Have you used any form of tobacco in the last 30 days? (Cigarettes, Smokeless Tobacco, Cigars, and/or Pipes): No    Has patient been referred to the Quitline?: N/A patient is not a smoker  Patient has been referred for addiction treatment: N/A  Samantha Ross 10/18/2016, 9:46 AM

## 2016-10-18 NOTE — BHH Group Notes (Signed)
Adult Therapy Group Note  Date:  10/18/2016  Time:  10:00-11:00AM  Group Topic/Focus: Fears and Healthy/Unhealthy Coping Skills  Building Self Esteem:   The Focus of this group was to continue yesterday's discussion about radical acceptance and to introduce some exercises that can be helpful in recognizing judgmental statements, substituting self-encouraging statements, and utilizing judgment defusion.  A meditation was used at the conclusion of group.  Handouts were given for 3 of the exercises shared.  An emphasis was placed on seeking professional supports outside of the hospital to continue to build coping skills.  Participation Level:  Active  Participation Quality:  Attentive and Sharing  Affect:  Appropriate  Cognitive:  Alert and Appropriate  Insight: Good  Engagement in Group:  Engaged  Modes of Intervention:  Discussion, Exploration and Support  Additional Comments:  The patient expressed herself frequently and with great support of others.  Ambrose MantleMareida Grossman-Orr, LCSW 10/18/2016   12:35pm

## 2016-10-18 NOTE — [facility] Suicide Risk Assessment (Signed)
[facility]   Principal Problem: MDD (major depressive disorder) Discharge Diagnoses:  Patient Active Problem List   Diagnosis Date Noted  . MDD (major depressive disorder) [F32.9] 10/17/2016    Total Time spent with patient: 30 minutes  Musculoskeletal: Strength & Muscle Tone: within normal limits Gait & Station: normal Patient leans: N/A  Psychiatric Specialty Exam: ROS  Blood pressure 130/60, pulse 99, temperature 98.6 F (37 C), resp. rate 20, height 5\' 6"  (1.676 m), weight 89.8 kg (198 lb), last menstrual period 10/05/2016.Body mass index is 31.96 kg/m.  General Appearance: Casual  Eye Contact::  Good  Speech:  Clear and Coherent409  Volume:  Normal  Mood:  Euthymic  Affect:  Congruent  Thought Process:  Goal Directed  Orientation:  Full (Time, Place, and Person)  Thought Content:  WDL and Logical  Suicidal Thoughts:  No  Homicidal Thoughts:  No  Memory:  Immediate;   Good Recent;   Good Remote;   Good  Judgement:  Good  Insight:  Good  Psychomotor Activity:  Normal  Concentration:  Good  Recall:  Good  Fund of Knowledge:Good  Language: Good  Akathisia:  No  Handed:  Right  AIMS (if indicated):     Assets:  Communication Skills Desire for Improvement Housing Physical Health Resilience Social Support  Sleep:  Number of Hours: 6.75  Cognition: WNL  ADL's:  Intact   Mental Status Per Nursing Assessment::   On Admission:  Self-harm behaviors  Demographic Factors:  Adolescent or young adult  Loss Factors: NA  Historical Factors: Impulsivity  Risk Reduction Factors:   Sense of responsibility to family, Religious beliefs about death, Living with another person, especially a relative, Positive social support, Positive therapeutic relationship and Positive coping skills or problem solving skills  Continued Clinical Symptoms:  Dysthymia  Cognitive Features That Contribute To Risk:  None    Suicide Risk:  Minimal: No  identifiable suicidal ideation.  Patients presenting with no risk factors but with morbid ruminations; may be classified as minimal risk based on the severity of the depressive symptoms  Follow-up Information    [facility] [place]. Go to.   Why:  Your social worker will call on Monday to set an appointment with Dr. Docia ChuckKoirala, and will call you with the date and time. You will be called on your father's phone [phone] and have provided written consent for a message to be left with your father. Contact information: [address] [place]  [id number] [phone]       Dr. Doran HeaterMargaret Barnes. Go to.   Why:  Your social worker will call on Monday to set an appointment with your therapist, and will call you with the date/time. You will be called on your father's phone [phone] and have provided written consent for a message to be left with your father. Contact information: [address] [place]  [id number] [phone]          Plan Of Care/Follow-up recommendations:  See D/C summary Activity:  as tolertaed Diet:  un changed from past Other:  follow up with pcp  ARFEEN,SYED T., MD 10/18/2016, 10:30 AM
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
## 2016-10-18 NOTE — Progress Notes (Signed)
D    Pt interacts well with others and is compliant with treatment   She is somewhat superficial and tends to minimize her situation of being in the hospital    She does report improvement in her mood and feelings  Pt tends to get in other pts business and giving advice   She has been reminded to focus on her own problems A    Verbal support given   Medications administered and effectiveness monitored    Q 15 min checks R    Pt is safe at present time and somewhat receptive to verbal support

## 2016-10-20 DIAGNOSIS — F329 Major depressive disorder, single episode, unspecified: Secondary | ICD-10-CM | POA: Diagnosis not present

## 2016-10-27 DIAGNOSIS — F329 Major depressive disorder, single episode, unspecified: Secondary | ICD-10-CM | POA: Diagnosis not present

## 2016-11-04 DIAGNOSIS — F329 Major depressive disorder, single episode, unspecified: Secondary | ICD-10-CM | POA: Diagnosis not present

## 2016-11-13 DIAGNOSIS — F411 Generalized anxiety disorder: Secondary | ICD-10-CM | POA: Diagnosis not present

## 2016-11-13 DIAGNOSIS — F321 Major depressive disorder, single episode, moderate: Secondary | ICD-10-CM | POA: Diagnosis not present

## 2016-11-18 DIAGNOSIS — F329 Major depressive disorder, single episode, unspecified: Secondary | ICD-10-CM | POA: Diagnosis not present

## 2016-11-24 DIAGNOSIS — F411 Generalized anxiety disorder: Secondary | ICD-10-CM | POA: Diagnosis not present

## 2016-11-24 DIAGNOSIS — F4310 Post-traumatic stress disorder, unspecified: Secondary | ICD-10-CM | POA: Diagnosis not present

## 2016-11-24 DIAGNOSIS — F321 Major depressive disorder, single episode, moderate: Secondary | ICD-10-CM | POA: Diagnosis not present

## 2016-12-02 DIAGNOSIS — F329 Major depressive disorder, single episode, unspecified: Secondary | ICD-10-CM | POA: Diagnosis not present

## 2016-12-21 DIAGNOSIS — F329 Major depressive disorder, single episode, unspecified: Secondary | ICD-10-CM | POA: Diagnosis not present

## 2016-12-23 DIAGNOSIS — F329 Major depressive disorder, single episode, unspecified: Secondary | ICD-10-CM | POA: Diagnosis not present

## 2016-12-28 DIAGNOSIS — R300 Dysuria: Secondary | ICD-10-CM | POA: Diagnosis not present

## 2016-12-29 DIAGNOSIS — F329 Major depressive disorder, single episode, unspecified: Secondary | ICD-10-CM | POA: Diagnosis not present

## 2017-01-05 DIAGNOSIS — F329 Major depressive disorder, single episode, unspecified: Secondary | ICD-10-CM | POA: Diagnosis not present

## 2017-01-20 DIAGNOSIS — F329 Major depressive disorder, single episode, unspecified: Secondary | ICD-10-CM | POA: Diagnosis not present

## 2017-05-11 DIAGNOSIS — F329 Major depressive disorder, single episode, unspecified: Secondary | ICD-10-CM | POA: Diagnosis not present

## 2017-06-18 DIAGNOSIS — F419 Anxiety disorder, unspecified: Secondary | ICD-10-CM | POA: Diagnosis not present

## 2017-06-18 DIAGNOSIS — F321 Major depressive disorder, single episode, moderate: Secondary | ICD-10-CM | POA: Diagnosis not present

## 2017-06-19 DIAGNOSIS — F329 Major depressive disorder, single episode, unspecified: Secondary | ICD-10-CM | POA: Diagnosis not present

## 2017-07-05 DIAGNOSIS — F329 Major depressive disorder, single episode, unspecified: Secondary | ICD-10-CM | POA: Diagnosis not present

## 2017-07-07 DIAGNOSIS — F329 Major depressive disorder, single episode, unspecified: Secondary | ICD-10-CM | POA: Diagnosis not present

## 2017-07-19 DIAGNOSIS — F329 Major depressive disorder, single episode, unspecified: Secondary | ICD-10-CM | POA: Diagnosis not present

## 2017-08-03 DIAGNOSIS — F329 Major depressive disorder, single episode, unspecified: Secondary | ICD-10-CM | POA: Diagnosis not present

## 2017-11-01 DIAGNOSIS — F329 Major depressive disorder, single episode, unspecified: Secondary | ICD-10-CM | POA: Diagnosis not present

## 2017-12-02 DIAGNOSIS — F329 Major depressive disorder, single episode, unspecified: Secondary | ICD-10-CM | POA: Diagnosis not present

## 2017-12-30 DIAGNOSIS — F329 Major depressive disorder, single episode, unspecified: Secondary | ICD-10-CM | POA: Diagnosis not present

## 2018-01-27 DIAGNOSIS — F329 Major depressive disorder, single episode, unspecified: Secondary | ICD-10-CM | POA: Diagnosis not present

## 2018-02-23 DIAGNOSIS — F329 Major depressive disorder, single episode, unspecified: Secondary | ICD-10-CM | POA: Diagnosis not present

## 2018-03-23 DIAGNOSIS — F329 Major depressive disorder, single episode, unspecified: Secondary | ICD-10-CM | POA: Diagnosis not present

## 2018-04-28 DIAGNOSIS — F329 Major depressive disorder, single episode, unspecified: Secondary | ICD-10-CM | POA: Diagnosis not present

## 2018-05-26 DIAGNOSIS — F329 Major depressive disorder, single episode, unspecified: Secondary | ICD-10-CM | POA: Diagnosis not present

## 2018-06-08 DIAGNOSIS — M25572 Pain in left ankle and joints of left foot: Secondary | ICD-10-CM | POA: Diagnosis not present

## 2018-06-08 DIAGNOSIS — S93492A Sprain of other ligament of left ankle, initial encounter: Secondary | ICD-10-CM | POA: Diagnosis not present

## 2018-06-15 DIAGNOSIS — F419 Anxiety disorder, unspecified: Secondary | ICD-10-CM | POA: Diagnosis not present

## 2018-06-15 DIAGNOSIS — F321 Major depressive disorder, single episode, moderate: Secondary | ICD-10-CM | POA: Diagnosis not present

## 2018-06-23 DIAGNOSIS — F329 Major depressive disorder, single episode, unspecified: Secondary | ICD-10-CM | POA: Diagnosis not present

## 2018-07-28 ENCOUNTER — Ambulatory Visit
Admission: RE | Admit: 2018-07-28 | Discharge: 2018-07-28 | Disposition: A | Discharge status: Home / Self Care | Payer: BLUE CROSS/BLUE SHIELD | Source: Ambulatory Visit | Attending: Family Medicine | Admitting: Family Medicine

## 2018-07-28 ENCOUNTER — Other Ambulatory Visit: Payer: Self-pay | Admitting: Family Medicine

## 2018-07-28 DIAGNOSIS — R079 Chest pain, unspecified: Secondary | ICD-10-CM

## 2018-07-28 DIAGNOSIS — R0602 Shortness of breath: Secondary | ICD-10-CM | POA: Diagnosis not present

## 2018-08-19 DIAGNOSIS — R0602 Shortness of breath: Secondary | ICD-10-CM | POA: Diagnosis not present

## 2018-08-19 DIAGNOSIS — R05 Cough: Secondary | ICD-10-CM | POA: Diagnosis not present

## 2018-09-16 DIAGNOSIS — Z23 Encounter for immunization: Secondary | ICD-10-CM | POA: Diagnosis not present

## 2018-10-09 DIAGNOSIS — F329 Major depressive disorder, single episode, unspecified: Secondary | ICD-10-CM | POA: Diagnosis not present

## 2018-10-27 DIAGNOSIS — F329 Major depressive disorder, single episode, unspecified: Secondary | ICD-10-CM | POA: Diagnosis not present

## 2018-12-22 DIAGNOSIS — F329 Major depressive disorder, single episode, unspecified: Secondary | ICD-10-CM | POA: Diagnosis not present

## 2019-01-26 ENCOUNTER — Other Ambulatory Visit: Payer: Self-pay

## 2019-01-26 ENCOUNTER — Emergency Department (HOSPITAL_COMMUNITY): Payer: BLUE CROSS/BLUE SHIELD

## 2019-01-26 ENCOUNTER — Encounter (HOSPITAL_COMMUNITY): Payer: Self-pay | Admitting: Emergency Medicine

## 2019-01-26 ENCOUNTER — Inpatient Hospital Stay (HOSPITAL_COMMUNITY)
Admission: EM | Admit: 2019-01-26 | Discharge: 2019-01-28 | DRG: 871 | Disposition: A | Discharge status: Home / Self Care | Payer: BLUE CROSS/BLUE SHIELD | Attending: Internal Medicine | Admitting: Internal Medicine

## 2019-01-26 DIAGNOSIS — J4531 Mild persistent asthma with (acute) exacerbation: Secondary | ICD-10-CM | POA: Diagnosis not present

## 2019-01-26 DIAGNOSIS — Z79899 Other long term (current) drug therapy: Secondary | ICD-10-CM

## 2019-01-26 DIAGNOSIS — J189 Pneumonia, unspecified organism: Secondary | ICD-10-CM | POA: Diagnosis present

## 2019-01-26 DIAGNOSIS — A419 Sepsis, unspecified organism: Secondary | ICD-10-CM | POA: Diagnosis not present

## 2019-01-26 DIAGNOSIS — R0902 Hypoxemia: Secondary | ICD-10-CM | POA: Diagnosis not present

## 2019-01-26 DIAGNOSIS — J45901 Unspecified asthma with (acute) exacerbation: Secondary | ICD-10-CM | POA: Diagnosis present

## 2019-01-26 DIAGNOSIS — Z1159 Encounter for screening for other viral diseases: Secondary | ICD-10-CM

## 2019-01-26 DIAGNOSIS — F329 Major depressive disorder, single episode, unspecified: Secondary | ICD-10-CM | POA: Diagnosis not present

## 2019-01-26 DIAGNOSIS — J9601 Acute respiratory failure with hypoxia: Secondary | ICD-10-CM | POA: Diagnosis present

## 2019-01-26 DIAGNOSIS — R0600 Dyspnea, unspecified: Secondary | ICD-10-CM

## 2019-01-26 DIAGNOSIS — Z87891 Personal history of nicotine dependence: Secondary | ICD-10-CM

## 2019-01-26 DIAGNOSIS — J9691 Respiratory failure, unspecified with hypoxia: Secondary | ICD-10-CM | POA: Diagnosis present

## 2019-01-26 DIAGNOSIS — J4541 Moderate persistent asthma with (acute) exacerbation: Secondary | ICD-10-CM | POA: Diagnosis present

## 2019-01-26 DIAGNOSIS — J984 Other disorders of lung: Secondary | ICD-10-CM | POA: Diagnosis not present

## 2019-01-26 DIAGNOSIS — R000 Tachycardia, unspecified: Secondary | ICD-10-CM | POA: Diagnosis not present

## 2019-01-26 DIAGNOSIS — J45909 Unspecified asthma, uncomplicated: Secondary | ICD-10-CM | POA: Diagnosis not present

## 2019-01-26 DIAGNOSIS — F32A Depression, unspecified: Secondary | ICD-10-CM | POA: Diagnosis present

## 2019-01-26 HISTORY — DX: Unspecified asthma, uncomplicated: J45.909

## 2019-01-26 HISTORY — DX: Depression, unspecified: F32.A

## 2019-01-26 LAB — PROTIME-INR
INR: 1.1 (ref 0.8–1.2)
Prothrombin Time: 14.3 seconds (ref 11.4–15.2)

## 2019-01-26 LAB — SARS CORONAVIRUS 2 BY RT PCR (HOSPITAL ORDER, PERFORMED IN ~~LOC~~ HOSPITAL LAB): SARS Coronavirus 2: NEGATIVE

## 2019-01-26 LAB — CBC WITH DIFFERENTIAL/PLATELET
Abs Immature Granulocytes: 0.03 10*3/uL (ref 0.00–0.07)
Basophils Absolute: 0 10*3/uL (ref 0.0–0.1)
Basophils Relative: 0 %
Eosinophils Absolute: 0 10*3/uL (ref 0.0–0.5)
Eosinophils Relative: 0 %
HCT: 42 % (ref 36.0–46.0)
Hemoglobin: 13.7 g/dL (ref 12.0–15.0)
Immature Granulocytes: 0 %
Lymphocytes Relative: 15 %
Lymphs Abs: 1.6 10*3/uL (ref 0.7–4.0)
MCH: 27.6 pg (ref 26.0–34.0)
MCHC: 32.6 g/dL (ref 30.0–36.0)
MCV: 84.5 fL (ref 80.0–100.0)
Monocytes Absolute: 0.4 10*3/uL (ref 0.1–1.0)
Monocytes Relative: 4 %
Neutro Abs: 9.1 10*3/uL — ABNORMAL HIGH (ref 1.7–7.7)
Neutrophils Relative %: 81 %
Platelets: 345 10*3/uL (ref 150–400)
RBC: 4.97 MIL/uL (ref 3.87–5.11)
RDW: 13.2 % (ref 11.5–15.5)
WBC: 11.2 10*3/uL — ABNORMAL HIGH (ref 4.0–10.5)
nRBC: 0 % (ref 0.0–0.2)

## 2019-01-26 LAB — BASIC METABOLIC PANEL
Anion gap: 10 (ref 5–15)
BUN: 10 mg/dL (ref 6–20)
CO2: 24 mmol/L (ref 22–32)
Calcium: 9.3 mg/dL (ref 8.9–10.3)
Chloride: 104 mmol/L (ref 98–111)
Creatinine, Ser: 0.77 mg/dL (ref 0.44–1.00)
GFR calc Af Amer: >60 mL/min (ref >60)
GFR calc non Af Amer: >60 mL/min (ref >60)
Glucose, Bld: 113 mg/dL — ABNORMAL HIGH (ref 70–99)
Potassium: 4 mmol/L (ref 3.5–5.1)
Sodium: 138 mmol/L (ref 135–145)

## 2019-01-26 LAB — D-DIMER, QUANTITATIVE: D-Dimer, Quant: 1.41 ug/mL-FEU — ABNORMAL HIGH (ref 0.00–0.50)

## 2019-01-26 LAB — HEPATIC FUNCTION PANEL
ALT: 16 U/L (ref 0–44)
AST: 20 U/L (ref 15–41)
Albumin: 4.1 g/dL (ref 3.5–5.0)
Alkaline Phosphatase: 46 U/L (ref 38–126)
Bilirubin, Direct: 0.1 mg/dL (ref 0.0–0.2)
Indirect Bilirubin: 0.4 mg/dL (ref 0.3–0.9)
Total Bilirubin: 0.5 mg/dL (ref 0.3–1.2)
Total Protein: 8.1 g/dL (ref 6.5–8.1)

## 2019-01-26 LAB — PROCALCITONIN: Procalcitonin: 0.21 ng/mL

## 2019-01-26 LAB — I-STAT BETA HCG BLOOD, ED (MC, WL, AP ONLY): I-stat hCG, quantitative: <5 m[IU]/mL (ref <5)

## 2019-01-26 LAB — LACTIC ACID, PLASMA
Lactic Acid, Venous: 2 mmol/L (ref 0.5–1.9)
Lactic Acid, Venous: 1.1 mmol/L (ref 0.5–1.9)

## 2019-01-26 LAB — APTT: aPTT: 32 seconds (ref 24–36)

## 2019-01-26 MED ORDER — DOXYCYCLINE HYCLATE 100 MG PO TABS
100.0000 mg | ORAL_TABLET | Freq: Two times a day (BID) | ORAL | Status: DC
  Administered 2019-01-26 – 2019-01-28 (×4): 100 mg via ORAL
  Filled 2019-01-26 (×4): qty 1

## 2019-01-26 MED ORDER — ONDANSETRON HCL 4 MG/2ML IJ SOLN: 4.0000 mg | Freq: Four times a day (QID) | INTRAMUSCULAR | Status: DC | PRN

## 2019-01-26 MED ORDER — ALBUTEROL SULFATE (2.5 MG/3ML) 0.083% IN NEBU: 2.5000 mg | INHALATION_SOLUTION | Freq: Four times a day (QID) | RESPIRATORY_TRACT | Status: DC

## 2019-01-26 MED ORDER — SODIUM CHLORIDE 0.9 % IV BOLUS: 1000.0000 mL | Freq: Once | INTRAVENOUS | Status: DC

## 2019-01-26 MED ORDER — PREDNISONE 20 MG PO TABS
40.0000 mg | ORAL_TABLET | Freq: Every day | ORAL | Status: DC
  Administered 2019-01-27 – 2019-01-28 (×2): 40 mg via ORAL
  Filled 2019-01-26 (×2): qty 2

## 2019-01-26 MED ORDER — SODIUM CHLORIDE 0.9 % IV SOLN
INTRAVENOUS | Status: DC
  Administered 2019-01-26 – 2019-01-27 (×2): via INTRAVENOUS

## 2019-01-26 MED ORDER — SODIUM CHLORIDE 0.9 % IV BOLUS
1000.0000 mL | Freq: Once | INTRAVENOUS | Status: DC
  Administered 2019-01-26: 1000 mL via INTRAVENOUS

## 2019-01-26 MED ORDER — SERTRALINE HCL 50 MG PO TABS
150.0000 mg | ORAL_TABLET | Freq: Every day | ORAL | Status: DC
  Administered 2019-01-27 – 2019-01-28 (×2): 150 mg via ORAL
  Filled 2019-01-26 (×2): qty 1

## 2019-01-26 MED ORDER — SODIUM CHLORIDE 0.9 % IV SOLN
1.0000 g | Freq: Once | INTRAVENOUS | Status: AC
  Administered 2019-01-26: 1 g via INTRAVENOUS
  Filled 2019-01-26: qty 10

## 2019-01-26 MED ORDER — ACETAMINOPHEN 325 MG PO TABS
650.0000 mg | ORAL_TABLET | Freq: Four times a day (QID) | ORAL | Status: DC | PRN
  Administered 2019-01-26: 650 mg via ORAL
  Filled 2019-01-26: qty 2

## 2019-01-26 MED ORDER — SODIUM CHLORIDE 0.9 % IV SOLN
100.0000 mg | Freq: Once | INTRAVENOUS | Status: AC
  Administered 2019-01-26: 100 mg via INTRAVENOUS
  Filled 2019-01-26: qty 100

## 2019-01-26 MED ORDER — ALBUTEROL SULFATE HFA 108 (90 BASE) MCG/ACT IN AERS
4.0000 | INHALATION_SPRAY | RESPIRATORY_TRACT | Status: DC
  Administered 2019-01-26 (×4): 4 via RESPIRATORY_TRACT

## 2019-01-26 MED ORDER — ALBUTEROL SULFATE HFA 108 (90 BASE) MCG/ACT IN AERS
2.0000 | INHALATION_SPRAY | Freq: Four times a day (QID) | RESPIRATORY_TRACT | Status: DC
  Administered 2019-01-26 – 2019-01-28 (×8): 2 via RESPIRATORY_TRACT
  Filled 2019-01-26: qty 6.7

## 2019-01-26 MED ORDER — SODIUM CHLORIDE 0.9 % IV SOLN
1.0000 g | INTRAVENOUS | Status: DC
  Administered 2019-01-27 – 2019-01-28 (×2): 1 g via INTRAVENOUS
  Filled 2019-01-26 (×2): qty 10

## 2019-01-26 MED ORDER — ONDANSETRON HCL 4 MG PO TABS: 4.0000 mg | ORAL_TABLET | Freq: Four times a day (QID) | ORAL | Status: DC | PRN

## 2019-01-26 MED ORDER — ALBUTEROL SULFATE (2.5 MG/3ML) 0.083% IN NEBU: 2.5000 mg | INHALATION_SOLUTION | RESPIRATORY_TRACT | Status: DC | PRN

## 2019-01-26 MED ORDER — MOMETASONE FURO-FORMOTEROL FUM 100-5 MCG/ACT IN AERO
2.0000 | INHALATION_SPRAY | Freq: Two times a day (BID) | RESPIRATORY_TRACT | Status: DC
  Administered 2019-01-26 – 2019-01-28 (×4): 2 via RESPIRATORY_TRACT
  Filled 2019-01-26: qty 8.8

## 2019-01-26 MED ORDER — GUAIFENESIN ER 600 MG PO TB12
600.0000 mg | ORAL_TABLET | Freq: Two times a day (BID) | ORAL | Status: DC
  Administered 2019-01-26 – 2019-01-28 (×4): 600 mg via ORAL
  Filled 2019-01-26 (×4): qty 1

## 2019-01-26 MED ORDER — ACETAMINOPHEN 650 MG RE SUPP: 650.0000 mg | Freq: Four times a day (QID) | RECTAL | Status: DC | PRN

## 2019-01-26 MED ORDER — ALBUTEROL SULFATE HFA 108 (90 BASE) MCG/ACT IN AERS
4.0000 | INHALATION_SPRAY | Freq: Once | RESPIRATORY_TRACT | Status: AC
  Administered 2019-01-26: 4 via RESPIRATORY_TRACT
  Filled 2019-01-26: qty 6.7

## 2019-01-26 MED ORDER — SODIUM CHLORIDE 0.9 % IV BOLUS
1000.0000 mL | Freq: Once | INTRAVENOUS | Status: AC
  Administered 2019-01-26: 1000 mL via INTRAVENOUS

## 2019-01-26 MED ORDER — ENOXAPARIN SODIUM 40 MG/0.4ML ~~LOC~~ SOLN
40.0000 mg | SUBCUTANEOUS | Status: DC
  Administered 2019-01-26 – 2019-01-27 (×2): 40 mg via SUBCUTANEOUS
  Filled 2019-01-26 (×2): qty 0.4

## 2019-01-26 MED ORDER — IOHEXOL 350 MG/ML SOLN
75.0000 mL | Freq: Once | INTRAVENOUS | Status: AC | PRN
  Administered 2019-01-26: 75 mL via INTRAVENOUS

## 2019-01-26 MED ORDER — SODIUM CHLORIDE 0.9% FLUSH
3.0000 mL | Freq: Two times a day (BID) | INTRAVENOUS | Status: DC
  Administered 2019-01-27 – 2019-01-28 (×3): 3 mL via INTRAVENOUS

## 2019-01-26 MED ORDER — METHYLPREDNISOLONE SODIUM SUCC 125 MG IJ SOLR
125.0000 mg | Freq: Once | INTRAMUSCULAR | Status: AC
  Administered 2019-01-26: 125 mg via INTRAVENOUS
  Filled 2019-01-26: qty 2

## 2019-01-26 NOTE — ED Notes (Signed)
Nurse navigator spoke with patient and ED doctor. Samantha Ross patient's father phone number [phone] to MD to update plan of care.

## 2019-01-26 NOTE — ED Provider Notes (Signed)
MSE was initiated and I personally evaluated the patient and placed orders (if any) at  9:03 AM on Jan 26, 2019.  The patient appears stable so that the remainder of the MSE may be completed by another provider.  23 y.o. F with PMH/o Asthma, Depression resents for evaluation of cough, difficulty breathing, fever that began 4 hours ago.  Patient reports that she started feeling body aches, generalized malaise yesterday.  She noted a fever of 102.1.  Patient states that initially, she had cough that was nonproductive.  Patient states she feels like she is wheezing.  She has a history of asthma but states that she has never had been hospitalized or intubated for asthma.  Patient states she has been using her Dulera inhaler but does not have any other inhalers.  Patient states she also feels some chest pain that she describes as "swallowing something bad and it getting stuck."  Patient states she has not had any known sick contacts or known COVID-19 exposure.  She has been self quarantine for the last several months. She denies any OCP use, recent immobilization, prior history of DVT/PE, recent surgery, leg swelling, or long travel.  Evaluation, she has decreased lung sounds with decreased air movement. No obvious wheezing noted. On my evaluation, her heart rate is between 120-125 and she is tachypneic with RR in 25-30 range.  Her O2 sat fluctuate between 85% - 88% on room air.   Patient moved back to main ED for appropriate level of care.   Portions of this note were generated with Scientist, clinical (histocompatibility and immunogenetics). Dictation errors may occur despite best attempts at proofreading.     Maxwell Caul, PA-C 01/26/19 1053    Blane Ohara, MD 01/26/19 1535

## 2019-01-26 NOTE — ED Notes (Signed)
Nurse navigator Tammy Sours to call father for update per pt request.

## 2019-01-26 NOTE — ED Notes (Addendum)
ED TO INPATIENT HANDOFF REPORT  ED Nurse Name and Phone #:  Dorathy Daft [phone]  S Name/Age/Gender Samantha Ross 23 y.o. female Room/Bed: 024C/024C  Code Status   Code Status: Full Code  Home/SNF/Other Home Patient oriented to: self, place, time and situation Is this baseline? Yes   Triage Complete: Triage complete  Chief Complaint breathing issues/asthma  Triage Note Started to feel bad yesterday states cannot breath she states   Allergies No Known Allergies  Level of Care/Admitting Diagnosis ED Disposition    ED Disposition Condition Comment   Admit  Hospital Area: [facility] [[id number]]  Level of Care: Telemetry Medical [104]  I expect the patient will be discharged within 24 hours: No (not a candidate for 5C-Observation unit)  Covid Evaluation: Screening Protocol (No Symptoms)  Diagnosis: Respiratory failure with hypoxia [facility]) [[id number]]  Admitting Physician: Clydie Braun [[id number]]  Attending Physician: Clydie Braun [[id number]]  PT Class (Do Not Modify): Observation [104]  PT Acc Code (Do Not Modify): Observation [10022]       B Medical/Surgery History Past Medical History:  Diagnosis Date  . Asthma   . Depression       A IV Location/Drains/Wounds Patient Lines/Drains/Airways Status   Active Line/Drains/Airways    Name:   Placement date:   Placement time:   Site:   Days:   Peripheral IV 01/26/19 Right Antecubital   01/26/19    0935    Antecubital   less than 1          Intake/Output Last 24 hours  Intake/Output Summary (Last 24 hours) at 01/26/2019 1258 Last data filed at 01/26/2019 1024 Gross per 24 hour  Intake 100 ml  Output -  Net 100 ml    Labs/Imaging Results for orders placed or performed during the hospital encounter of 01/26/19 (from the past 48 hour(s))  CBC with Differential     Status: Abnormal   Collection Time: 01/26/19  9:32 AM  Result Value Ref Range   WBC 11.2 (H) 4.0 - 10.5 K/uL   RBC 4.97 3.87 -  5.11 MIL/uL   Hemoglobin 13.7 12.0 - 15.0 g/dL   HCT 08.6 57.8 - 46.9 %   MCV 84.5 80.0 - 100.0 fL   MCH 27.6 26.0 - 34.0 pg   MCHC 32.6 30.0 - 36.0 g/dL   RDW 62.9 52.8 - 41.3 %   Platelets 345 150 - 400 K/uL   nRBC 0.0 0.0 - 0.2 %   Neutrophils Relative % 81 %   Neutro Abs 9.1 (H) 1.7 - 7.7 K/uL   Lymphocytes Relative 15 %   Lymphs Abs 1.6 0.7 - 4.0 K/uL   Monocytes Relative 4 %   Monocytes Absolute 0.4 0.1 - 1.0 K/uL   Eosinophils Relative 0 %   Eosinophils Absolute 0.0 0.0 - 0.5 K/uL   Basophils Relative 0 %   Basophils Absolute 0.0 0.0 - 0.1 K/uL   Immature Granulocytes 0 %   Abs Immature Granulocytes 0.03 0.00 - 0.07 K/uL    Comment: Performed at [facility], [address], [place], [place] [address]  Basic metabolic panel     Status: Abnormal   Collection Time: 01/26/19  9:32 AM  Result Value Ref Range   Sodium 138 135 - 145 mmol/L   Potassium 4.0 3.5 - 5.1 mmol/L   Chloride 104 98 - 111 mmol/L   CO2 24 22 - 32 mmol/L   Glucose, Bld 113 (H) 70 - 99 mg/dL  BUN 10 6 - 20 mg/dL   Creatinine, Ser 1.610.77 0.44 - 1.00 mg/dL   Calcium 9.3 8.9 - 09.610.3 mg/dL   GFR calc non Af Amer >60 >60 mL/min   GFR calc Af Amer >60 >60 mL/min   Anion gap 10 5 - 15    Comment: Performed at [facility], [address] [place], [place] [address]  Hepatic function panel     Status: None   Collection Time: 01/26/19  9:32 AM  Result Value Ref Range   Total Protein 8.1 6.5 - 8.1 g/dL   Albumin 4.1 3.5 - 5.0 g/dL   AST 20 15 - 41 U/L   ALT 16 0 - 44 U/L   Alkaline Phosphatase 46 38 - 126 U/L   Total Bilirubin 0.5 0.3 - 1.2 mg/dL   Bilirubin, Direct 0.1 0.0 - 0.2 mg/dL   Indirect Bilirubin 0.4 0.3 - 0.9 mg/dL    Comment: Performed at [facility], [address] [place], [place] [address]  D-dimer, quantitative (not at [facility])     Status: Abnormal   Collection Time: 01/26/19  9:32 AM  Result Value Ref Range   D-Dimer, Quant 1.41 (H) 0.00 - 0.50 ug/mL-FEU    Comment:  (NOTE) At the manufacturer cut-off of 0.50 ug/mL FEU, this assay has been documented to exclude PE with a sensitivity and negative predictive value of 97 to 99%.  At this time, this assay has not been approved by the FDA to exclude DVT/VTE. Results should be correlated with clinical presentation. Performed at [facility], [address] [place], [place] [address]   SARS Coronavirus 2 (CEPHEID- Performed in [facility] hospital lab), Hosp Order     Status: None   Collection Time: 01/26/19  9:38 AM  Result Value Ref Range   SARS Coronavirus 2 NEGATIVE NEGATIVE    Comment: (NOTE) If result is NEGATIVE SARS-CoV-2 target nucleic acids are NOT DETECTED. The SARS-CoV-2 RNA is generally detectable in upper and lower  respiratory specimens during the acute phase of infection. The lowest  concentration of SARS-CoV-2 viral copies this assay can detect is 250  copies / mL. A negative result does not preclude SARS-CoV-2 infection  and should not be used as the sole basis for treatment or other  patient management decisions.  A negative result may occur with  improper specimen collection / handling, submission of specimen other  than nasopharyngeal swab, presence of viral mutation(s) within the  areas targeted by this assay, and inadequate number of viral copies  (<250 copies / mL). A negative result must be combined with clinical  observations, patient history, and epidemiological information. If result is POSITIVE SARS-CoV-2 target nucleic acids are DETECTED. The SARS-CoV-2 RNA is generally detectable in upper and lower  respiratory specimens dur ing the acute phase of infection.  Positive  results are indicative of active infection with SARS-CoV-2.  Clinical  correlation with patient history and other diagnostic information is  necessary to determine patient infection status.  Positive results do  not rule out bacterial infection or co-infection with other viruses. If result is  PRESUMPTIVE POSTIVE SARS-CoV-2 nucleic acids MAY BE PRESENT.   A presumptive positive result was obtained on the submitted specimen  and confirmed on repeat testing.  While 2019 novel coronavirus  (SARS-CoV-2) nucleic acids may be present in the submitted sample  additional confirmatory testing may be necessary for epidemiological  and / or clinical management purposes  to differentiate between  SARS-CoV-2 and other Sarbecovirus currently known to infect humans.  If clinically indicated additional testing with an alternate test  methodology [phone]) is advised. The SARS-CoV-2 RNA is generally  detectable in upper and lower respiratory sp ecimens during the acute  phase of infection. The expected result is Negative. Fact Sheet for Patients:  BoilerBrush.com.cy Fact Sheet for Healthcare Providers: https://pope.com/ This test is not yet approved or cleared by the [place] FDA and has been authorized for detection and/or diagnosis of SARS-CoV-2 by FDA under an Emergency Use Authorization (EUA).  This EUA will remain in effect (meaning this test can be used) for the duration of the COVID-19 declaration under Section 564(b)(1) of the Act, 21 U.S.C. section 360bbb-3(b)(1), unless the authorization is terminated or revoked sooner. Performed at [facility], [address], [place], [place] [address]   I-Stat Beta hCG blood, ED ([facility], [facility], [facility] only)     Status: None   Collection Time: 01/26/19  9:43 AM  Result Value Ref Range   I-stat hCG, quantitative <5.0 <5 mIU/mL   Comment 3            Comment:   GEST. AGE      CONC.  (mIU/mL)   <=1 WEEK        5 - 50     2 WEEKS       50 - 500     3 WEEKS       100 - 10,000     4 WEEKS     1,000 - 30,000        FEMALE AND NON-PREGNANT FEMALE:     LESS THAN 5 mIU/mL    Ct Angio Chest Pe W And/or Wo Contrast  Result Date: 01/26/2019 CLINICAL DATA:  Asthma, increased difficulty breathing,  elevated D-dimer. EXAM: CT ANGIOGRAPHY CHEST WITH CONTRAST TECHNIQUE: Multidetector CT imaging of the chest was performed using the standard protocol during bolus administration of intravenous contrast. Multiplanar CT image reconstructions and MIPs were obtained to evaluate the vascular anatomy. CONTRAST:  91mL OMNIPAQUE IOHEXOL 350 MG/ML SOLN COMPARISON:  None. FINDINGS: Cardiovascular: Image quality is degraded by respiratory motion, limiting the evaluation of subsegmental pulmonary arteries. Otherwise, no filling defects to indicate pulmonary embolus. Pulmonic trunk is borderline enlarged. Heart is enlarged. No pericardial effusion. Mediastinum/Nodes: No pathologically enlarged mediastinal lymph nodes. Bihilar lymphoid tissue. No axillary adenopathy. Esophagus is grossly unremarkable. Lungs/Pleura: Basilar dependent peribronchovascular consolidation and ground-glass, seen predominantly in the lower lobes. No pleural fluid. Airway is unremarkable. Upper Abdomen: Visualized portion of the liver is unremarkable. There may be sludge in the gallbladder. Visualized portions of the adrenal glands, kidneys, spleen, pancreas, stomach and bowel are grossly unremarkable. Musculoskeletal: Negative. Review of the MIP images confirms the above findings. IMPRESSION: 1. Evaluation of subsegmental pulmonary arteries is limited by respiratory motion. Otherwise, no definitive evidence of pulmonary embolus. 2. Bilateral lower lobe peribronchovascular consolidation and ground-glass, most indicative of pneumonia. Aspiration is not excluded. 3. Borderline enlarged pulmonic trunk can be seen with pulmonary arterial hypertension. 4. Possible gallbladder sludge. Electronically Signed   By: Leanna Battles M.D.   On: 01/26/2019 12:30   Dg Chest Portable 1 View  Result Date: 01/26/2019 CLINICAL DATA:  23 year old female with a history of shortness of breath and fever EXAM: PORTABLE CHEST 1 VIEW COMPARISON:  07/28/2018 FINDINGS:  Cardiomediastinal silhouette unchanged in size and contour. Low lung volumes with haziness of the lower lungs, and no pleural effusion or pneumothorax. No definite confluent airspace disease.  Scoliotic curvature with no displaced fracture. IMPRESSION: Low lung volumes with the hazy appearance of the lower lungs likely related to overlying soft tissues of the chest wall. If there is concern for possible left basilar acute process, a formal PA and lateral chest x-ray may be useful with improved inspiration. Electronically Signed   By: Gilmer Mor D.O.   On: 01/26/2019 09:34    Pending Labs Unresulted Labs (From admission, onward)    Start     Ordered   01/27/19 0500  HIV antibody (Routine Testing)  Tomorrow morning,   R     01/26/19 1255   01/27/19 0500  CBC  Tomorrow morning,   R     01/26/19 1255   01/27/19 0500  Basic metabolic panel  Tomorrow morning,   R     01/26/19 1255   01/26/19 1254  Protime-INR  Add-on,   R     01/26/19 1255   01/26/19 1254  APTT  Add-on,   R     01/26/19 1255   01/26/19 1252  Lactic acid, plasma  Add-on,   STAT     01/26/19 1255   01/26/19 1252  Procalcitonin  Add-on,   R     01/26/19 1255   01/26/19 1251  Culture, sputum-assessment  ONCE - STAT,   R     01/26/19 1255   01/26/19 1251  Legionella Pneumophila Serogp 1 Ur Ag  Once,   R     01/26/19 1255   01/26/19 1247  Gram stain  Once,   R     01/26/19 1255   01/26/19 1247  Strep pneumoniae urinary antigen  Once,   R     01/26/19 1255   01/26/19 0939  Blood culture (routine x 2)  BLOOD CULTURE X 2,   STAT    Question:  Patient immune status  Answer:  Normal   01/26/19 0938          Vitals/Pain Today's Vitals   01/26/19 1130 01/26/19 1130 01/26/19 1145 01/26/19 1235  BP: 110/73  (!) 112/46   Pulse: (!) 105  (!) 125   Resp: (!) 42  (!) 24   Temp:      TempSrc:      SpO2: 96%  96% 94%  PainSc:  0-No pain      Isolation Precautions Droplet and Contact precautions  Medications Medications   albuterol (VENTOLIN HFA) 108 (90 Base) MCG/ACT inhaler 4 puff (4 puffs Inhalation Given 01/26/19 1214)  0.9 %  sodium chloride infusion ( Intravenous New Bag/Given 01/26/19 0955)  doxycycline (VIBRAMYCIN) 100 mg in sodium chloride 0.9 % 250 mL IVPB (100 mg Intravenous New Bag/Given 01/26/19 1133)  sertraline (ZOLOFT) tablet 150 mg (has no administration in time range)  enoxaparin (LOVENOX) injection 40 mg (has no administration in time range)  sodium chloride flush (NS) 0.9 % injection 3 mL (has no administration in time range)  ondansetron (ZOFRAN) tablet 4 mg (has no administration in time range)    Or  ondansetron (ZOFRAN) injection 4 mg (has no administration in time range)  acetaminophen (TYLENOL) tablet 650 mg (has no administration in time range)    Or  acetaminophen (TYLENOL) suppository 650 mg (has no administration in time range)  albuterol (PROVENTIL) (2.5 MG/3ML) 0.083% nebulizer solution 2.5 mg (has no administration in time range)  albuterol (PROVENTIL) (2.5 MG/3ML) 0.083% nebulizer solution 2.5 mg (has no administration in time range)  guaiFENesin (MUCINEX) 12 hr tablet 600 mg (has no administration in  time range)  cefTRIAXone (ROCEPHIN) 1 g in sodium chloride 0.9 % 100 mL IVPB (has no administration in time range)  doxycycline (VIBRA-TABS) tablet 100 mg (has no administration in time range)  methylPREDNISolone sodium succinate (SOLU-MEDROL) 125 mg/2 mL injection 125 mg (125 mg Intravenous Given 01/26/19 0945)  albuterol (VENTOLIN HFA) 108 (90 Base) MCG/ACT inhaler 4 puff (4 puffs Inhalation Given 01/26/19 0948)  cefTRIAXone (ROCEPHIN) 1 g in sodium chloride 0.9 % 100 mL IVPB (0 g Intravenous Stopped 01/26/19 1024)  sodium chloride 0.9 % bolus 1,000 mL (1,000 mLs Intravenous New Bag/Given 01/26/19 1258)  iohexol (OMNIPAQUE) 350 MG/ML injection 75 mL (75 mLs Intravenous Contrast Given 01/26/19 1221)    Mobility walks Low fall risk   Focused Assessments Pulmonary Assessment  Handoff:  Lung sounds: Bilateral Breath Sounds: Coarse crackles L Breath Sounds: Coarse crackles R Breath Sounds: Coarse crackles O2 Device: Nasal Cannula O2 Flow Rate (L/min): 2 L/min      R Recommendations: See Admitting Provider Note  Report given to: Arion RN Additional Notes:

## 2019-01-26 NOTE — ED Provider Notes (Signed)
[facility] EMERGENCY DEPARTMENT Provider Note   CSN: [id number] Arrival date & time: 01/26/19  0845    History   Chief Complaint Chief Complaint  Patient presents with  . Cough    HPI Samantha Ross is a 23 y.o. female.     Patient with history of asthma and pneumonia as a child, uses inhaler as needed, no history of admission for asthma presents with worsening shortness of breath and feeling overall unwell since yesterday.  Patient had a fever 101 yesterday.  No significant sick contacts no known contacts with CO VID.  Patient denies blood clot history, leg swelling, recent surgeries.  Patient does have mild cough and exertional shortness of breath.  No cardiac history.  Past cigarette smoker.     Past Medical History:  Diagnosis Date  . Asthma   . Depression     There are no active problems to display for this patient.      OB History   No obstetric history on file.      Home Medications    Prior to Admission medications   Medication Sig Start Date End Date Taking? Authorizing Provider  sertraline (ZOLOFT) 100 MG tablet Take 150 mg by mouth daily. 10/06/18  Yes [provider]    Family History No family history on file.  Social History Social History   Tobacco Use  . Smoking status: Former Games developer  . Smokeless tobacco: Never Used  Substance Use Topics  . Alcohol use: Not on file  . Drug use: Not on file     Allergies   Patient has no known allergies.   Review of Systems Review of Systems  Constitutional: Negative for chills and fever.  HENT: Negative for congestion.   Eyes: Negative for visual disturbance.  Respiratory: Positive for cough and shortness of breath.   Cardiovascular: Positive for chest pain. Negative for leg swelling.  Gastrointestinal: Negative for abdominal pain and vomiting.  Genitourinary: Negative for dysuria and flank pain.  Musculoskeletal: Negative for back pain, neck pain and neck  stiffness.  Skin: Negative for rash.  Neurological: Negative for light-headedness and headaches.     Physical Exam Updated Vital Signs BP (!) 112/46   Pulse (!) 125   Temp 99.7 F (37.6 C) (Oral)   Resp (!) 24   SpO2 94%   Physical Exam Vitals signs and nursing note reviewed.  Constitutional:      Appearance: She is well-developed.  HENT:     Head: Normocephalic and atraumatic.  Eyes:     General:        Right eye: No discharge.        Left eye: No discharge.     Conjunctiva/sclera: Conjunctivae normal.  Neck:     Musculoskeletal: Normal range of motion and neck supple.     Trachea: No tracheal deviation.  Cardiovascular:     Rate and Rhythm: Regular rhythm. Tachycardia present.  Pulmonary:     Effort: Respiratory distress: tachypnea.     Breath sounds: Rales (left lower) present.  Abdominal:     General: There is no distension.     Palpations: Abdomen is soft.     Tenderness: There is no abdominal tenderness. There is no guarding.  Musculoskeletal:        General: No swelling or tenderness.  Skin:    General: Skin is warm.     Findings: No rash.  Neurological:     General: No focal deficit present.  Mental Status: She is alert and oriented to person, place, and time.      ED Treatments / Results  Labs (all labs ordered are listed, but only abnormal results are displayed) Labs Reviewed  CBC WITH DIFFERENTIAL/PLATELET - Abnormal; Notable for the following components:      Result Value   WBC 11.2 (*)    Neutro Abs 9.1 (*)    All other components within normal limits  BASIC METABOLIC PANEL - Abnormal; Notable for the following components:   Glucose, Bld 113 (*)    All other components within normal limits  D-DIMER, QUANTITATIVE (NOT AT [facility]) - Abnormal; Notable for the following components:   D-Dimer, Quant 1.41 (*)    All other components within normal limits  SARS CORONAVIRUS 2 (HOSPITAL ORDER, PERFORMED IN [facility])  CULTURE, BLOOD  (ROUTINE X 2)  CULTURE, BLOOD (ROUTINE X 2)  HEPATIC FUNCTION PANEL  I-STAT BETA HCG BLOOD, ED ([facility], [facility], [facility] ONLY)    EKG EKG Interpretation  Date/Time:  Thursday Jan 26 2019 09:11:13 EDT Ventricular Rate:  112 PR Interval:    QRS Duration: 92 QT Interval:  337 QTC Calculation: 460 R Axis:   94 Text Interpretation:  Sinus tachycardia Probable left atrial enlargement Borderline right axis deviation Minimal ST depression, diffuse leads Confirmed by Kristine Royal [phone]), editor Barbette Hair [phone]) on 01/26/2019 9:40:47 AM   Radiology Ct Angio Chest Pe W And/or Wo Contrast  Result Date: 01/26/2019 CLINICAL DATA:  Asthma, increased difficulty breathing, elevated D-dimer. EXAM: CT ANGIOGRAPHY CHEST WITH CONTRAST TECHNIQUE: Multidetector CT imaging of the chest was performed using the standard protocol during bolus administration of intravenous contrast. Multiplanar CT image reconstructions and MIPs were obtained to evaluate the vascular anatomy. CONTRAST:  75mL OMNIPAQUE IOHEXOL 350 MG/ML SOLN COMPARISON:  None. FINDINGS: Cardiovascular: Image quality is degraded by respiratory motion, limiting the evaluation of subsegmental pulmonary arteries. Otherwise, no filling defects to indicate pulmonary embolus. Pulmonic trunk is borderline enlarged. Heart is enlarged. No pericardial effusion. Mediastinum/Nodes: No pathologically enlarged mediastinal lymph nodes. Bihilar lymphoid tissue. No axillary adenopathy. Esophagus is grossly unremarkable. Lungs/Pleura: Basilar dependent peribronchovascular consolidation and ground-glass, seen predominantly in the lower lobes. No pleural fluid. Airway is unremarkable. Upper Abdomen: Visualized portion of the liver is unremarkable. There may be sludge in the gallbladder. Visualized portions of the adrenal glands, kidneys, spleen, pancreas, stomach and bowel are grossly unremarkable. Musculoskeletal: Negative. Review of the MIP images confirms the above findings.  IMPRESSION: 1. Evaluation of subsegmental pulmonary arteries is limited by respiratory motion. Otherwise, no definitive evidence of pulmonary embolus. 2. Bilateral lower lobe peribronchovascular consolidation and ground-glass, most indicative of pneumonia. Aspiration is not excluded. 3. Borderline enlarged pulmonic trunk can be seen with pulmonary arterial hypertension. 4. Possible gallbladder sludge. Electronically Signed   By: Leanna Battles M.D.   On: 01/26/2019 12:30   Dg Chest Portable 1 View  Result Date: 01/26/2019 CLINICAL DATA:  23 year old female with a history of shortness of breath and fever EXAM: PORTABLE CHEST 1 VIEW COMPARISON:  07/28/2018 FINDINGS: Cardiomediastinal silhouette unchanged in size and contour. Low lung volumes with haziness of the lower lungs, and no pleural effusion or pneumothorax. No definite confluent airspace disease. Scoliotic curvature with no displaced fracture. IMPRESSION: Low lung volumes with the hazy appearance of the lower lungs likely related to overlying soft tissues of the chest wall. If there is concern for possible left basilar acute process, a formal PA and lateral chest x-ray may be useful with  improved inspiration. Electronically Signed   By: Gilmer Mor D.O.   On: 01/26/2019 09:34    Procedures .Critical Care Performed by: Blane Ohara, MD Authorized by: Blane Ohara, MD   Critical care provider statement:    Critical care time (minutes):  75   Critical care start time:  01/26/2019 9:00 AM   Critical care end time:  01/26/2019 10:15 AM   Critical care time was exclusive of:  Separately billable procedures and treating other patients and teaching time   Critical care was necessary to treat or prevent imminent or life-threatening deterioration of the following conditions:  Respiratory failure   Critical care was time spent personally by me on the following activities:  Discussions with consultants, evaluation of patient's response to treatment,  examination of patient, ordering and performing treatments and interventions, ordering and review of laboratory studies, ordering and review of radiographic studies, pulse oximetry, re-evaluation of patient's condition, obtaining history from patient or surrogate and review of old charts   (including critical care time)  Medications Ordered in ED Medications  albuterol (VENTOLIN HFA) 108 (90 Base) MCG/ACT inhaler 4 puff (4 puffs Inhalation Given 01/26/19 1214)  0.9 %  sodium chloride infusion ( Intravenous New Bag/Given 01/26/19 0955)  doxycycline (VIBRAMYCIN) 100 mg in sodium chloride 0.9 % 250 mL IVPB (100 mg Intravenous New Bag/Given 01/26/19 1133)  sodium chloride 0.9 % bolus 1,000 mL (has no administration in time range)  methylPREDNISolone sodium succinate (SOLU-MEDROL) 125 mg/2 mL injection 125 mg (125 mg Intravenous Given 01/26/19 0945)  albuterol (VENTOLIN HFA) 108 (90 Base) MCG/ACT inhaler 4 puff (4 puffs Inhalation Given 01/26/19 0948)  cefTRIAXone (ROCEPHIN) 1 g in sodium chloride 0.9 % 100 mL IVPB (0 g Intravenous Stopped 01/26/19 1024)  iohexol (OMNIPAQUE) 350 MG/ML injection 75 mL (75 mLs Intravenous Contrast Given 01/26/19 1221)     Initial Impression / Assessment and Plan / ED Course  I have reviewed the triage vital signs and the nursing notes.  Pertinent labs & imaging results that were available during my care of the patient were reviewed by me and considered in my medical decision making (see chart for details).       Patient with asthma presents with worsening dyspnea, hypoxia requiring 2 L nasal cannula on arrival.  Patient's oxygen saturation 86% on arrival.  Patient did improve with nasal cannula.  Repeat albuterol inhaler ordered.  Patient does have focal rales on exam concerning for pneumonia whether bacterial versus CO VID.  Contact/airborne precautions, COVID test, blood work, blood cultures, Rocephin, pregnancy test, portable chest x-ray, d-dimer with tachycardia and  new and worsening shortness of breath and hypoxia.  IV steroids ordered.  Patient be monitored closely plan for admission.  Samantha Ross was evaluated in Emergency Department on 01/26/2019 for the symptoms described in the history of present illness. She was evaluated in the context of the global COVID-19 pandemic, which necessitated consideration that the patient might be at risk for infection with the SARS-CoV-2 virus that causes COVID-19. Institutional protocols and algorithms that pertain to the evaluation of patients at risk for COVID-19 are in a state of rapid change based on information released by regulatory bodies including the CDC and federal and state organizations. These policies and algorithms were followed during the patient's care in the ED.   Patient did improve in the emergency room.  With positive d-dimer and persistent tachycardia patient sent for CT scan of the chest.  CT scan negative for significant pulmonary embolism however concerning  for pneumonia.  Patient had already had antibiotics and cultures ordered.  Patient systolic blood pressure remained normal during the emergency room stay. Fluid bolus ordered.  Patient received repeat albuterol inhaler.  Discussed with hospitalist for admission.  The patients results and plan were reviewed and discussed.   Any x-rays performed were independently reviewed by myself.   Differential diagnosis were considered with the presenting HPI.  Medications  albuterol (VENTOLIN HFA) 108 (90 Base) MCG/ACT inhaler 4 puff (4 puffs Inhalation Given 01/26/19 1214)  0.9 %  sodium chloride infusion ( Intravenous New Bag/Given 01/26/19 0955)  doxycycline (VIBRAMYCIN) 100 mg in sodium chloride 0.9 % 250 mL IVPB (100 mg Intravenous New Bag/Given 01/26/19 1133)  sodium chloride 0.9 % bolus 1,000 mL (has no administration in time range)  methylPREDNISolone sodium succinate (SOLU-MEDROL) 125 mg/2 mL injection 125 mg (125 mg Intravenous Given 01/26/19  0945)  albuterol (VENTOLIN HFA) 108 (90 Base) MCG/ACT inhaler 4 puff (4 puffs Inhalation Given 01/26/19 0948)  cefTRIAXone (ROCEPHIN) 1 g in sodium chloride 0.9 % 100 mL IVPB (0 g Intravenous Stopped 01/26/19 1024)  iohexol (OMNIPAQUE) 350 MG/ML injection 75 mL (75 mLs Intravenous Contrast Given 01/26/19 1221)    Vitals:   01/26/19 1045 01/26/19 1130 01/26/19 1145 01/26/19 1235  BP: 119/61 110/73 (!) 112/46   Pulse: (!) 106 (!) 105 (!) 125   Resp: (!) 40 (!) 42 (!) 24   Temp:      TempSrc:      SpO2: 98% 96% 96% 94%    Final diagnoses:  Hypoxia  Acute dyspnea  Moderate persistent asthma with acute exacerbation  Community acquired pneumonia of left lower lobe of lung (HCC)    Admission/ observation were discussed with the admitting physician, patient and/or family and they are comfortable with the plan.   Final Clinical Impressions(s) / ED Diagnoses   Final diagnoses:  Hypoxia  Acute dyspnea  Moderate persistent asthma with acute exacerbation  Community acquired pneumonia of left lower lobe of lung [facility](HCC)    ED Discharge Orders    None       Blane OharaZavitz, Vinton Layson, MD 01/26/19 1242

## 2019-01-26 NOTE — ED Notes (Signed)
Patient transported to CT 

## 2019-01-26 NOTE — H&P (Signed)
History and Physical    Samantha Ross ZOX:[id number] DOB: [birth year] DOA: 01/26/2019  Referring MD/NP/PA: Blane OharaJoshua Zavitz, MD PCP: Darrow BussingKoirala, Dibas, MD  Patient coming from: home  Chief Complaint: Shortness of breath  I have personally briefly reviewed patient's old medical records in [facility]   HPI: Samantha Ross is a 23 y.o. female with medical history significant of asthma and depression; who presents with complaints of shortness breath that began yesterday.  Complains of having wheezing and chest discomfort with trying to take a deep breath in.  She will intermittently cough when shortness of breath is worse with some sputum production.  Associated symptoms included fever up to 102.18F at home and generalized malaise.  Denies having any recent sick contacts, nausea, vomiting, diarrhea, dysuria, headache, or muscle aches.  Used her Dulera inhaler twice daily as prescribed along with DayQuil and NyQuil without significant relief.  Patient notes that she does not have a rescue albuterol inhaler at home.  ED Course: Upon admission to the emergency department patient was seen to have a temperature of 99.7 F, heart rate 104/125, respirations 22-42, blood pressure is maintained, and O2 saturations improved from 86% to 98% after being placed on 2 L of nasal cannula oxygen.  Labs revealed WBC 11.2, D-dimer 1.41, and all labs relatively within normal limits.  CT angiogram of the chest was limited showing signs of a possible pneumonia, but no pulmonary embolus.  COVID-19 negative.  Cultures were obtained.  Patient was given albuterol, 125 mg of Solu-Medrol, 1 L normal saline IV fluids, doxycycline, and Rocephin.  Review of Systems  Constitutional: Positive for fever and malaise/fatigue.  HENT: Negative for ear discharge.   Eyes: Negative for double vision and photophobia.  Respiratory: Positive for cough, sputum production, shortness of breath and wheezing. Negative for stridor.    Cardiovascular: Positive for chest pain. Negative for leg swelling.  Gastrointestinal: Negative for abdominal pain, diarrhea and nausea.  Genitourinary: Negative for dysuria and frequency.  Musculoskeletal: Negative for joint pain and myalgias.  Skin: Negative for rash.  Neurological: Negative for loss of consciousness and headaches.  Endo/Heme/Allergies: Positive for environmental allergies. Does not bruise/bleed easily.  Psychiatric/Behavioral: Negative for memory loss and substance abuse.    Past Medical History:  Diagnosis Date  . Asthma   . Depression     History reviewed. No pertinent surgical history.   reports that she has quit smoking. She has never used smokeless tobacco. No history on file for alcohol and drug.  No Known Allergies  No significant family medical history of blood clots any other issues to her knowledge.  Prior to Admission medications   Medication Sig Start Date End Date Taking? Authorizing Provider  sertraline (ZOLOFT) 100 MG tablet Take 150 mg by mouth daily. 10/06/18  Yes [provider]    Physical Exam:  Constitutional: Young female who appears to be in some respiratory distress Vitals:   01/26/19 1045 01/26/19 1130 01/26/19 1145 01/26/19 1235  BP: 119/61 110/73 (!) 112/46   Pulse: (!) 106 (!) 105 (!) 125   Resp: (!) 40 (!) 42 (!) 24   Temp:      TempSrc:      SpO2: 98% 96% 96% 94%   Eyes: PERRL, lids and conjunctivae normal ENMT: Mucous membranes are moist. Posterior pharynx clear of any exudate or lesions. Normal dentition.  Neck: normal, supple, no masses, no thyromegaly Respiratory: Tachypneic with decreased aeration patient in mild expiratory wheeze appreciated.  Patient on 2 L  nasal cannula oxygen maintaining O2 saturations. Cardiovascular: Tachycardic, no murmurs / rubs / gallops. No extremity edema. 2+ pedal pulses. No carotid bruits.  Abdomen: no tenderness, no masses palpated. No hepatosplenomegaly. Bowel sounds positive.   Musculoskeletal: no clubbing / cyanosis. No joint deformity upper and lower extremities. Good ROM, no contractures. Normal muscle tone.  Skin: no rashes, lesions, ulcers. No induration Neurologic: CN 2-12 grossly intact. Sensation intact, DTR normal. Strength 5/5 in all 4.  Psychiatric: Normal judgment and insight. Alert and oriented x 3. Normal mood.     Labs on Admission: I have personally reviewed following labs and imaging studies  CBC: Recent Labs  Lab 01/26/19 0932  WBC 11.2*  NEUTROABS 9.1*  HGB 13.7  HCT 42.0  MCV 84.5  PLT 345   Basic Metabolic Panel: Recent Labs  Lab 01/26/19 0932  NA 138  K 4.0  CL 104  CO2 24  GLUCOSE 113*  BUN 10  CREATININE 0.77  CALCIUM 9.3   GFR: CrCl cannot be calculated (Unknown ideal weight.). Liver Function Tests: Recent Labs  Lab 01/26/19 0932  AST 20  ALT 16  ALKPHOS 46  BILITOT 0.5  PROT 8.1  ALBUMIN 4.1   No results for input(s): LIPASE, AMYLASE in the last 168 hours. No results for input(s): AMMONIA in the last 168 hours. Coagulation Profile: No results for input(s): INR, PROTIME in the last 168 hours. Cardiac Enzymes: No results for input(s): CKTOTAL, CKMB, CKMBINDEX, TROPONINI in the last 168 hours. BNP (last 3 results) No results for input(s): PROBNP in the last 8760 hours. HbA1C: No results for input(s): HGBA1C in the last 72 hours. CBG: No results for input(s): GLUCAP in the last 168 hours. Lipid Profile: No results for input(s): CHOL, HDL, LDLCALC, TRIG, CHOLHDL, LDLDIRECT in the last 72 hours. Thyroid Function Tests: No results for input(s): TSH, T4TOTAL, FREET4, T3FREE, THYROIDAB in the last 72 hours. Anemia Panel: No results for input(s): VITAMINB12, FOLATE, FERRITIN, TIBC, IRON, RETICCTPCT in the last 72 hours. Urine analysis: No results found for: COLORURINE, APPEARANCEUR, LABSPEC, PHURINE, GLUCOSEU, HGBUR, BILIRUBINUR, KETONESUR, PROTEINUR, UROBILINOGEN, NITRITE, LEUKOCYTESUR Sepsis Labs: Recent  Results (from the past 240 hour(s))  SARS Coronavirus 2 (CEPHEID- Performed in [facility] hospital lab), Hosp Order     Status: None   Collection Time: 01/26/19  9:38 AM  Result Value Ref Range Status   SARS Coronavirus 2 NEGATIVE NEGATIVE Final    Comment: (NOTE) If result is NEGATIVE SARS-CoV-2 target nucleic acids are NOT DETECTED. The SARS-CoV-2 RNA is generally detectable in upper and lower  respiratory specimens during the acute phase of infection. The lowest  concentration of SARS-CoV-2 viral copies this assay can detect is 250  copies / mL. A negative result does not preclude SARS-CoV-2 infection  and should not be used as the sole basis for treatment or other  patient management decisions.  A negative result may occur with  improper specimen collection / handling, submission of specimen other  than nasopharyngeal swab, presence of viral mutation(s) within the  areas targeted by this assay, and inadequate number of viral copies  (<250 copies / mL). A negative result must be combined with clinical  observations, patient history, and epidemiological information. If result is POSITIVE SARS-CoV-2 target nucleic acids are DETECTED. The SARS-CoV-2 RNA is generally detectable in upper and lower  respiratory specimens dur ing the acute phase of infection.  Positive  results are indicative of active infection with SARS-CoV-2.  Clinical  correlation with patient history and other  diagnostic information is  necessary to determine patient infection status.  Positive results do  not rule out bacterial infection or co-infection with other viruses. If result is PRESUMPTIVE POSTIVE SARS-CoV-2 nucleic acids MAY BE PRESENT.   A presumptive positive result was obtained on the submitted specimen  and confirmed on repeat testing.  While 2019 novel coronavirus  (SARS-CoV-2) nucleic acids may be present in the submitted sample  additional confirmatory testing may be necessary for epidemiological   and / or clinical management purposes  to differentiate between  SARS-CoV-2 and other Sarbecovirus currently known to infect humans.  If clinically indicated additional testing with an alternate test  methodology [phone]) is advised. The SARS-CoV-2 RNA is generally  detectable in upper and lower respiratory sp ecimens during the acute  phase of infection. The expected result is Negative. Fact Sheet for Patients:  BoilerBrush.com.cy Fact Sheet for Healthcare Providers: https://pope.com/ This test is not yet approved or cleared by the [place] FDA and has been authorized for detection and/or diagnosis of SARS-CoV-2 by FDA under an Emergency Use Authorization (EUA).  This EUA will remain in effect (meaning this test can be used) for the duration of the COVID-19 declaration under Section 564(b)(1) of the Act, 21 U.S.C. section 360bbb-3(b)(1), unless the authorization is terminated or revoked sooner. Performed at [facility], [address] [place], [place] [address]      Radiological Exams on Admission: Ct Angio Chest Pe W And/or Wo Contrast  Result Date: 01/26/2019 CLINICAL DATA:  Asthma, increased difficulty breathing, elevated D-dimer. EXAM: CT ANGIOGRAPHY CHEST WITH CONTRAST TECHNIQUE: Multidetector CT imaging of the chest was performed using the standard protocol during bolus administration of intravenous contrast. Multiplanar CT image reconstructions and MIPs were obtained to evaluate the vascular anatomy. CONTRAST:  75mL OMNIPAQUE IOHEXOL 350 MG/ML SOLN COMPARISON:  None. FINDINGS: Cardiovascular: Image quality is degraded by respiratory motion, limiting the evaluation of subsegmental pulmonary arteries. Otherwise, no filling defects to indicate pulmonary embolus. Pulmonic trunk is borderline enlarged. Heart is enlarged. No pericardial effusion. Mediastinum/Nodes: No pathologically enlarged mediastinal lymph nodes. Bihilar  lymphoid tissue. No axillary adenopathy. Esophagus is grossly unremarkable. Lungs/Pleura: Basilar dependent peribronchovascular consolidation and ground-glass, seen predominantly in the lower lobes. No pleural fluid. Airway is unremarkable. Upper Abdomen: Visualized portion of the liver is unremarkable. There may be sludge in the gallbladder. Visualized portions of the adrenal glands, kidneys, spleen, pancreas, stomach and bowel are grossly unremarkable. Musculoskeletal: Negative. Review of the MIP images confirms the above findings. IMPRESSION: 1. Evaluation of subsegmental pulmonary arteries is limited by respiratory motion. Otherwise, no definitive evidence of pulmonary embolus. 2. Bilateral lower lobe peribronchovascular consolidation and ground-glass, most indicative of pneumonia. Aspiration is not excluded. 3. Borderline enlarged pulmonic trunk can be seen with pulmonary arterial hypertension. 4. Possible gallbladder sludge. Electronically Signed   By: Leanna Battles M.D.   On: 01/26/2019 12:30   Dg Chest Portable 1 View  Result Date: 01/26/2019 CLINICAL DATA:  23 year old female with a history of shortness of breath and fever EXAM: PORTABLE CHEST 1 VIEW COMPARISON:  07/28/2018 FINDINGS: Cardiomediastinal silhouette unchanged in size and contour. Low lung volumes with haziness of the lower lungs, and no pleural effusion or pneumothorax. No definite confluent airspace disease. Scoliotic curvature with no displaced fracture. IMPRESSION: Low lung volumes with the hazy appearance of the lower lungs likely related to overlying soft tissues of the chest wall. If there is concern for possible left basilar acute process, a formal PA and lateral chest x-ray  may be useful with improved inspiration. Electronically Signed   By: Gilmer Mor D.O.   On: 01/26/2019 09:34    EKG: Independently reviewed.  Sinus tachycardia 160 bpm  Assessment/Plan Acute respiratory failure with hypoxia with asthma exacerbation:  Acute.  Patient presents with cough, wheezing, and new oxygen requirement.  Has history of asthma appears to be minimal intermittent on Dulera.  Chest x-ray showing signs of a pneumonia.  Screening COVID-19 testing negative. -Admit to a telemetry bed -Continuous pulse oximetry with nasal cannula oxygen to maintain O2 saturation -Check respiratory virus panel and send out COVID. -Albuterol inhaler every 6 hours and as needed as needed for shortness of breath/wheezing -Continue home medications of Dulera -Prednisone 40 mg daily -Mucinex  Sepsis 2/2 community-acquired pneumonia: Acute patient presents with reports of fever at home up to 102.7 F, tachycardia, tachypnea, and WBC  11.2.  Initial lactic acid was within normal limits..  CT angiogram of the chest showing bilateral consolidation concerning for pneumonia. Patient was given empiric antibiotics of ceftriaxone and doxycycline. -Sepsis protocol initiated  -Follow-up blood and sputum cultures -Add on lactic acid and pro-calcitonin -Continue Rocephin and doxycycline -Tylenol as needed for fever -Recheck CBC in a.m.  Depression: Stable -Continue Zoloft  DVT prophylaxis: lovenox  Code Status: Full  Family Communication: Plan of care with the patient's father over the phone. Disposition Plan: Possible discharge home in 1 to 3 days Consults called: None Admission status: Observation  Clydie Braun MD [facility] Pager [phone]   If 7PM-7AM, please contact night-coverage [email or web address] Password [facility]  01/26/2019, 12:38 PM

## 2019-01-26 NOTE — Progress Notes (Signed)
RN notified by lab of critical lab value.  Patient's lactic acid is 2.0.  Relayed to Dr. Katrinka Blazing, orders in for an additional fluid bolus for patient.  Will continue to monitor.

## 2019-01-26 NOTE — ED Triage Notes (Signed)
Started to feel bad yesterday states cannot breath she states

## 2019-01-27 DIAGNOSIS — A419 Sepsis, unspecified organism: Secondary | ICD-10-CM | POA: Diagnosis present

## 2019-01-27 DIAGNOSIS — J4541 Moderate persistent asthma with (acute) exacerbation: Secondary | ICD-10-CM | POA: Diagnosis present

## 2019-01-27 DIAGNOSIS — Z79899 Other long term (current) drug therapy: Secondary | ICD-10-CM | POA: Diagnosis not present

## 2019-01-27 DIAGNOSIS — Z87891 Personal history of nicotine dependence: Secondary | ICD-10-CM | POA: Diagnosis not present

## 2019-01-27 DIAGNOSIS — J9601 Acute respiratory failure with hypoxia: Secondary | ICD-10-CM | POA: Diagnosis present

## 2019-01-27 DIAGNOSIS — R0902 Hypoxemia: Secondary | ICD-10-CM | POA: Diagnosis present

## 2019-01-27 DIAGNOSIS — J189 Pneumonia, unspecified organism: Secondary | ICD-10-CM | POA: Diagnosis present

## 2019-01-27 DIAGNOSIS — F329 Major depressive disorder, single episode, unspecified: Secondary | ICD-10-CM | POA: Diagnosis present

## 2019-01-27 DIAGNOSIS — Z1159 Encounter for screening for other viral diseases: Secondary | ICD-10-CM | POA: Diagnosis not present

## 2019-01-27 LAB — CBC
HCT: 36.4 % (ref 36.0–46.0)
Hemoglobin: 12.2 g/dL (ref 12.0–15.0)
MCH: 27.5 pg (ref 26.0–34.0)
MCHC: 33.5 g/dL (ref 30.0–36.0)
MCV: 82.2 fL (ref 80.0–100.0)
Platelets: 289 10*3/uL (ref 150–400)
RBC: 4.43 MIL/uL (ref 3.87–5.11)
RDW: 13.2 % (ref 11.5–15.5)
WBC: 12.9 10*3/uL — ABNORMAL HIGH (ref 4.0–10.5)
nRBC: 0 % (ref 0.0–0.2)

## 2019-01-27 LAB — RESPIRATORY PANEL BY PCR

## 2019-01-27 LAB — BASIC METABOLIC PANEL
Anion gap: 12 (ref 5–15)
BUN: 11 mg/dL (ref 6–20)
CO2: 22 mmol/L (ref 22–32)
Calcium: 9.1 mg/dL (ref 8.9–10.3)
Chloride: 105 mmol/L (ref 98–111)
Creatinine, Ser: 0.69 mg/dL (ref 0.44–1.00)
GFR calc Af Amer: >60 mL/min (ref >60)
GFR calc non Af Amer: >60 mL/min (ref >60)
Glucose, Bld: 100 mg/dL — ABNORMAL HIGH (ref 70–99)
Potassium: 3.7 mmol/L (ref 3.5–5.1)
Sodium: 139 mmol/L (ref 135–145)

## 2019-01-27 LAB — LACTIC ACID, PLASMA: Lactic Acid, Venous: 1 mmol/L (ref 0.5–1.9)

## 2019-01-27 LAB — HIV ANTIBODY (ROUTINE TESTING W REFLEX): HIV Screen 4th Generation wRfx: NONREACTIVE

## 2019-01-27 LAB — STREP PNEUMONIAE URINARY ANTIGEN: Strep Pneumo Urinary Antigen: NEGATIVE

## 2019-01-27 MED ORDER — BENZONATATE 100 MG PO CAPS
100.0000 mg | ORAL_CAPSULE | Freq: Three times a day (TID) | ORAL | Status: DC | PRN
  Administered 2019-01-27: 100 mg via ORAL
  Filled 2019-01-27: qty 1

## 2019-01-27 NOTE — Progress Notes (Signed)
[facility] Progress Note  Patient: Samantha Ross ZOX:[id number]   PCP: Darrow BussingKoirala, Dibas, MD DOB: 03-Sep-1996   DOA: 01/26/2019   DOS: 01/27/2019   Date of Service: the patient was seen and examined on 01/27/2019  Brief hospital course: Pt. with PMH asthma; admitted on 01/26/2019, presented with complaint of shortness of breath cough and fever, was found to have multifocal pneumonia. Currently further plan is continue current treatment.  Subjective: Continues to have shortness of breath cough.  No chest pain at rest but when taking deep breaths she has pleuritic type chest pain as well as back pain.  No nausea no vomiting.  Reports dizziness when she stands up.  At times she feels that she is going to pass out.  Assessment and Plan: Acute respiratory failure with hypoxia with asthma exacerbation: Acute.  Patient presents with cough, wheezing, and new oxygen requirement.  Has history of asthma appears to be minimal intermittent on Dulera.  Chest x-ray showing signs of a pneumonia.  Screening COVID-19 testing negative. -Admit to a telemetry bed -Continuous pulse oximetry with nasal cannula oxygen to maintain O2 saturation -Negative respiratory virus panel and send out COVID. -Albuterol inhaler every 6 hours and as needed as needed for shortness of breath/wheezing -Continue home medications of Dulera -Prednisone 40 mg daily -Mucinex  Sepsis 2/2 community-acquired pneumonia: Acute patient presents with reports of fever at home up to 102.7 F, tachycardia, tachypnea, and WBC  11.2.  Initial lactic acid was within normal limits..  CT angiogram of the chest showing bilateral consolidation concerning for pneumonia. Patient was given empiric antibiotics of ceftriaxone and doxycycline. -Sepsis protocol initiated  -Follow-up blood and sputum cultures -Continue Rocephin and doxycycline -Tylenol as needed for fever  Depression: Stable -Continue Zoloft  Diet: Regular diet DVT Prophylaxis:  subcutaneous Heparin  Advance goals of care discussion: Full code  Family Communication: no family was present at bedside, at the time of interview. The pt provided permission to discuss medical plan with the family. Opportunity was given to ask question and all questions were answered satisfactorily.   Disposition:  Discharge to home.  Consultants: none Procedures: none  Scheduled Meds: . albuterol  2 puff Inhalation Q6H  . doxycycline  100 mg Oral Q12H  . enoxaparin (LOVENOX) injection  40 mg Subcutaneous Q24H  . guaiFENesin  600 mg Oral BID  . mometasone-formoterol  2 puff Inhalation BID  . predniSONE  40 mg Oral Q breakfast  . sertraline  150 mg Oral Daily  . sodium chloride flush  3 mL Intravenous Q12H   Continuous Infusions: . cefTRIAXone (ROCEPHIN)  IV 1 g (01/27/19 0943)  . sodium chloride     PRN Meds: acetaminophen **OR** acetaminophen, albuterol, benzonatate, ondansetron **OR** ondansetron (ZOFRAN) IV Antibiotics: Anti-infectives (From admission, onward)   Start     Dose/Rate Route Frequency Ordered Stop   01/27/19 1000  cefTRIAXone (ROCEPHIN) 1 g in sodium chloride 0.9 % 100 mL IVPB     1 g 200 mL/hr over 30 Minutes Intravenous Every 24 hours 01/26/19 1255 02/03/19 0959   01/26/19 2200  doxycycline (VIBRA-TABS) tablet 100 mg     100 mg Oral Every 12 hours 01/26/19 1255 02/02/19 2159   01/26/19 1100  doxycycline (VIBRAMYCIN) 100 mg in sodium chloride 0.9 % 250 mL IVPB     100 mg 125 mL/hr over 120 Minutes Intravenous  Once 01/26/19 1046 01/26/19 1341   01/26/19 0945  cefTRIAXone (ROCEPHIN) 1 g in sodium chloride 0.9 % 100 mL IVPB  1 g 200 mL/hr over 30 Minutes Intravenous  Once 01/26/19 0938 01/26/19 1024       Objective: Physical Exam: Vitals:   01/26/19 1800 01/27/19 0000 01/27/19 0400 01/27/19 0900  BP:  112/61 110/69 126/76  Pulse:  (!) 103 86 (!) 103  Resp:      Temp:  99.5 F (37.5 C) 98.4 F (36.9 C) 98.4 F (36.9 C)  TempSrc:  Oral Oral  Oral  SpO2:  91% 94% 92%  Weight: 96.8 kg     Height: 5\' 8"  (1.727 m)       Intake/Output Summary (Last 24 hours) at 01/27/2019 1854 Last data filed at 01/27/2019 7116 Gross per 24 hour  Intake 1729.86 ml  Output 1200 ml  Net 529.86 ml   Filed Weights   01/26/19 1800  Weight: 96.8 kg   General: Alert, Awake and Oriented to Time, Place and Person. Appear in moderate distress, affect appropriate Eyes: PERRL, Conjunctiva normal ENT: Oral Mucosa clear moist. Neck: no JVD, no Abnormal Mass Or lumps Cardiovascular: S1 and S2 Present, no Murmur, Peripheral Pulses Present Respiratory: increased respiratory effort, Bilateral Air entry equal and Decreased, no use of accessory muscle, bilateral  Crackles, no wheezes Abdomen: Bowel Sound present, Soft and no tenderness, no hernia Skin: no redness, no Rash, no induration Extremities: on Pedal edema, no calf tenderness Neurologic: Grossly no focal neuro deficit. Bilaterally Equal motor strength  Data Reviewed: CBC: Recent Labs  Lab 01/26/19 0932 01/27/19 0603  WBC 11.2* 12.9*  NEUTROABS 9.1*  --   HGB 13.7 12.2  HCT 42.0 36.4  MCV 84.5 82.2  PLT 345 289   Basic Metabolic Panel: Recent Labs  Lab 01/26/19 0932 01/27/19 0603  NA 138 139  K 4.0 3.7  CL 104 105  CO2 24 22  GLUCOSE 113* 100*  BUN 10 11  CREATININE 0.77 0.69  CALCIUM 9.3 9.1    Liver Function Tests: Recent Labs  Lab 01/26/19 0932  AST 20  ALT 16  ALKPHOS 46  BILITOT 0.5  PROT 8.1  ALBUMIN 4.1   No results for input(s): LIPASE, AMYLASE in the last 168 hours. No results for input(s): AMMONIA in the last 168 hours. Coagulation Profile: Recent Labs  Lab 01/26/19 1531  INR 1.1   Cardiac Enzymes: No results for input(s): CKTOTAL, CKMB, CKMBINDEX, TROPONINI in the last 168 hours. BNP (last 3 results) No results for input(s): PROBNP in the last 8760 hours. CBG: No results for input(s): GLUCAP in the last 168 hours. Studies: No results found.    Time spent: 35 minutes  Author: Lynden Oxford, MD [facility] 01/27/2019 6:54 PM  To reach On-call, see care teams to locate the attending and reach out to them via [email or web address]. If 7PM-7AM, please contact night-coverage If you still have difficulty reaching the attending provider, please page the [facility] (Director on Call) for [facility] on amion for assistance.

## 2019-01-28 LAB — BASIC METABOLIC PANEL
Anion gap: 11 (ref 5–15)
BUN: 13 mg/dL (ref 6–20)
CO2: 23 mmol/L (ref 22–32)
Calcium: 9.2 mg/dL (ref 8.9–10.3)
Chloride: 103 mmol/L (ref 98–111)
Creatinine, Ser: 0.75 mg/dL (ref 0.44–1.00)
GFR calc Af Amer: >60 mL/min (ref >60)
GFR calc non Af Amer: >60 mL/min (ref >60)
Glucose, Bld: 86 mg/dL (ref 70–99)
Potassium: 3.3 mmol/L — ABNORMAL LOW (ref 3.5–5.1)
Sodium: 137 mmol/L (ref 135–145)

## 2019-01-28 LAB — CBC
HCT: 37.9 % (ref 36.0–46.0)
Hemoglobin: 12.5 g/dL (ref 12.0–15.0)
MCH: 27.3 pg (ref 26.0–34.0)
MCHC: 33 g/dL (ref 30.0–36.0)
MCV: 82.8 fL (ref 80.0–100.0)
Platelets: 332 10*3/uL (ref 150–400)
RBC: 4.58 MIL/uL (ref 3.87–5.11)
RDW: 13.1 % (ref 11.5–15.5)
WBC: 10.1 10*3/uL (ref 4.0–10.5)
nRBC: 0 % (ref 0.0–0.2)

## 2019-01-28 LAB — LEGIONELLA PNEUMOPHILA SEROGP 1 UR AG: L. pneumophila Serogp 1 Ur Ag: NEGATIVE

## 2019-01-28 LAB — NOVEL CORONAVIRUS, NAA (HOSP ORDER, SEND-OUT TO REF LAB; TAT 18-24 HRS): SARS-CoV-2, NAA: NOT DETECTED

## 2019-01-28 MED ORDER — ALBUTEROL SULFATE HFA 108 (90 BASE) MCG/ACT IN AERS: 2.0000 | INHALATION_SPRAY | Freq: Four times a day (QID) | RESPIRATORY_TRACT | 0 refills | Status: AC | PRN

## 2019-01-28 MED ORDER — POLYVINYL ALCOHOL 1.4 % OP SOLN
1.0000 [drp] | OPHTHALMIC | Status: DC | PRN
  Administered 2019-01-28: 1 [drp] via OPHTHALMIC
  Filled 2019-01-28: qty 15

## 2019-01-28 MED ORDER — DOXYCYCLINE HYCLATE 100 MG PO TABS: 100.0000 mg | ORAL_TABLET | Freq: Two times a day (BID) | ORAL | 0 refills | Status: AC

## 2019-01-28 MED ORDER — CEPHALEXIN 500 MG PO CAPS: 500.0000 mg | ORAL_CAPSULE | Freq: Two times a day (BID) | ORAL | 0 refills | Status: AC

## 2019-01-28 MED ORDER — BENZONATATE 100 MG PO CAPS: 100.0000 mg | ORAL_CAPSULE | Freq: Three times a day (TID) | ORAL | 0 refills | Status: AC | PRN

## 2019-01-28 MED ORDER — CEPHALEXIN 500 MG PO CAPS
500.0000 mg | ORAL_CAPSULE | Freq: Two times a day (BID) | ORAL | Status: DC
  Administered 2019-01-28: 500 mg via ORAL
  Filled 2019-01-28: qty 1

## 2019-01-28 MED ORDER — PREDNISONE 10 MG PO TABS: ORAL_TABLET | ORAL | 0 refills | Status: AC

## 2019-01-28 MED ORDER — GUAIFENESIN ER 600 MG PO TB12: 600.0000 mg | ORAL_TABLET | Freq: Two times a day (BID) | ORAL | 0 refills | Status: AC

## 2019-01-28 NOTE — Discharge Summary (Signed)
[facility] Discharge Summary   Patient: Samantha Ross ZOX:[id number]   PCP: Samantha Ross, Dibas, MD DOB: 1996-03-02   Date of admission: 01/26/2019   Date of discharge: 01/28/2019     Discharge Diagnoses:  Principal Problem:   Respiratory failure with hypoxia [facility](HCC) Active Problems:   Asthma exacerbation   Depression   Sepsis (HCC)   Community acquired pneumonia   Acute respiratory failure with hypoxia (HCC)   Admitted From: home Disposition:  home  Recommendations for Outpatient Follow-up:  1. Please follow-up with PCP in 1 week  Follow-up Information    Ross, Dibas, MD. Schedule an appointment as soon as possible for a visit in 1 week(s).   Specialty:  Family Medicine Contact information: [address] [place] [address] [phone]          Diet recommendation: Cardiac diet  Activity: The patient is advised to gradually reintroduce usual activities.  Discharge Condition: good  Code Status: Full code  History of present illness: As per the H and P dictated on admission, "Samantha Ross is a 23 y.o. female with medical history significant of asthma and depression; who presents with complaints of shortness breath that began yesterday.  Complains of having wheezing and chest discomfort with trying to take a deep breath in.  She will intermittently cough when shortness of breath is worse with some sputum production.  Associated symptoms included fever up to 102.59F at home and generalized malaise.  Denies having any recent sick contacts, nausea, vomiting, diarrhea, dysuria, headache, or muscle aches.  Used her Dulera inhaler twice daily as prescribed along with DayQuil and NyQuil without significant relief.  Patient notes that she does not have a rescue albuterol inhaler at home.  ED Course: Upon admission to the emergency department patient was seen to have a temperature of 99.7 F, heart rate 104/125, respirations 22-42, blood  pressure is maintained, and O2 saturations improved from 86% to 98% after being placed on 2 L of nasal cannula oxygen.  Labs revealed WBC 11.2, D-dimer 1.41, and all labs relatively within normal limits.  CT angiogram of the chest was limited showing signs of a possible pneumonia, but no pulmonary embolus.  COVID-19 negative.  Cultures were obtained.  Patient was given albuterol, 125 mg of Solu-Medrol, 1 L normal saline IV fluids, doxycycline, and Rocephin."  Hospital Course:  Summary of her active problems in the hospital is as following. Acute respiratory failure with hypoxia with asthma exacerbation:  Patient presents with cough, wheezing, and new oxygen requirement. Has history of asthma appears to be minimal intermittent on Dulera. Chest x-ray showing signs of a pneumonia. Screening  COVID-19testing negative. -Negative respiratory virus panel and send out COVID. -Albuterolinhalerevery 6 hours and as needed as needed for shortness of breath/wheezing -Continue home medications of Dulera -Prednisone taper -Mucinex  Sepsis2/2community-acquired pneumonia: Acute patient presents withreports of fever at home up to 102.7 F,tachycardia, tachypnea, andWBC 11.2. Initial lactic acid was within normal limits.. CT angiogram of the chest showing bilateral consolidation concerning for pneumonia.Patient was given empiric antibiotics ofceftriaxone and doxycycline.  Depression: Stable -Continue Zoloft  Patient was ambulatory without any assistance. On the day of the discharge the patient's vitals were stable , and no other acute medical condition were reported by patient. the patient was felt [facility].  Consultants: none Procedures: none  DISCHARGE MEDICATION: Allergies as of 01/28/2019   No Known Allergies     Medication List  TAKE these medications   albuterol 108 (90 Base) MCG/ACT inhaler Commonly known as:  VENTOLIN HFA Inhale 2 puffs into the  lungs every 6 (six) hours as needed for wheezing or shortness of breath.   benzonatate 100 MG capsule Commonly known as:  TESSALON Take 1 capsule (100 mg total) by mouth 3 (three) times daily as needed for cough.   cephALEXin 500 MG capsule Commonly known as:  Keflex Take 1 capsule (500 mg total) by mouth 2 (two) times daily for 3 days.   doxycycline 100 MG tablet Commonly known as:  VIBRA-TABS Take 1 tablet (100 mg total) by mouth 2 (two) times daily for 3 days.   guaiFENesin 600 MG 12 hr tablet Commonly known as:  MUCINEX Take 1 tablet (600 mg total) by mouth 2 (two) times daily for 6 days.   predniSONE 10 MG tablet Commonly known as:  DELTASONE Take  daily for 2days,Take  daily for 2days,Take  daily for 2days, then stop   sertraline 100 MG tablet Commonly known as:  ZOLOFT Take 150 mg by mouth daily.      No Known Allergies Discharge Instructions    Diet - low sodium heart healthy   Complete by:  As directed    Discharge instructions   Complete by:  As directed    It is important that you read the given instructions as well as go over your medication list with RN to help you understand your care after this hospitalization.  Discharge Instructions: Please follow-up with PCP in 1-2 weeks  Please request your primary care physician to go over all Hospital Tests and Procedure/Radiological results at the follow up. Please get all Hospital records sent to your PCP by signing hospital release before you go home.   Do not take more than prescribed Pain, Sleep and Anxiety Medications. You were cared for by a hospitalist during your hospital stay. If you have any questions about your discharge medications or the care you received while you were in the hospital after you are discharged, you can call the unit @ you were admitted to and ask to speak with the hospitalist on call if the hospitalist that took care of you is not available.  Once you are discharged, your  primary care physician will handle any further medical issues. Please note that NO REFILLS for any discharge medications will be authorized once you are discharged, as it is imperative that you return to your primary care physician (or establish a relationship with a primary care physician if you do not have one) for your aftercare needs so that they can reassess your need for medications and monitor your lab values. You Must read complete instructions/literature along with all the possible adverse reactions/side effects for all the Medicines you take and that have been prescribed to you. Take any new Medicines after you have completely understood and accept all the possible adverse reactions/side effects. Wear Seat belts while driving. If you have smoked or chewed Tobacco in the last 2 yrs please stop smoking and/or stop any Recreational drug use.  If you drink alcohol, please moderate the use and do not drive, operating heavy machinery, perform activities at heights, swimming or participation in water activities or provide baby sitting services under influence.   Increase activity slowly   Complete by:  As directed      Discharge Exam: Filed Weights   01/26/19 1800  Weight: 96.8 kg   Vitals:   01/28/19 0545 01/28/19 0827  BP: 111/69  115/72  Pulse:    Resp: 18 18  Temp: 98.6 F (37 C) 97.9 F (36.6 C)  SpO2: 96% 99%   General: Appear in no distress, no Rash; Oral Mucosa moist. Cardiovascular: S1 and S2 Present, no Murmur, no JVD Respiratory: Bilateral Air entry present and Clear to Auscultation, no Crackles, no wheezes Abdomen: Bowel Sound present, Soft and no tenderness Extremities: no Pedal edema, no calf tenderness Neurology: Grossly no focal neuro deficit.  The results of significant diagnostics from this hospitalization (including imaging, microbiology, ancillary and laboratory) are listed below for reference.    Significant Diagnostic Studies: Ct Angio Chest Pe W And/or Wo  Contrast  Result Date: 01/26/2019 CLINICAL DATA:  Asthma, increased difficulty breathing, elevated D-dimer. EXAM: CT ANGIOGRAPHY CHEST WITH CONTRAST TECHNIQUE: Multidetector CT imaging of the chest was performed using the standard protocol during bolus administration of intravenous contrast. Multiplanar CT image reconstructions and MIPs were obtained to evaluate the vascular anatomy. CONTRAST:  75mL OMNIPAQUE IOHEXOL 350 MG/ML SOLN COMPARISON:  None. FINDINGS: Cardiovascular: Image quality is degraded by respiratory motion, limiting the evaluation of subsegmental pulmonary arteries. Otherwise, no filling defects to indicate pulmonary embolus. Pulmonic trunk is borderline enlarged. Heart is enlarged. No pericardial effusion. Mediastinum/Nodes: No pathologically enlarged mediastinal lymph nodes. Bihilar lymphoid tissue. No axillary adenopathy. Esophagus is grossly unremarkable. Lungs/Pleura: Basilar dependent peribronchovascular consolidation and ground-glass, seen predominantly in the lower lobes. No pleural fluid. Airway is unremarkable. Upper Abdomen: Visualized portion of the liver is unremarkable. There may be sludge in the gallbladder. Visualized portions of the adrenal glands, kidneys, spleen, pancreas, stomach and bowel are grossly unremarkable. Musculoskeletal: Negative. Review of the MIP images confirms the above findings. IMPRESSION: 1. Evaluation of subsegmental pulmonary arteries is limited by respiratory motion. Otherwise, no definitive evidence of pulmonary embolus. 2. Bilateral lower lobe peribronchovascular consolidation and ground-glass, most indicative of pneumonia. Aspiration is not excluded. 3. Borderline enlarged pulmonic trunk can be seen with pulmonary arterial hypertension. 4. Possible gallbladder sludge. Electronically Signed   By: Leanna Battles M.D.   On: 01/26/2019 12:30   Dg Chest Portable 1 View  Result Date: 01/26/2019 CLINICAL DATA:  23 year old female with a history of  shortness of breath and fever EXAM: PORTABLE CHEST 1 VIEW COMPARISON:  07/28/2018 FINDINGS: Cardiomediastinal silhouette unchanged in size and contour. Low lung volumes with haziness of the lower lungs, and no pleural effusion or pneumothorax. No definite confluent airspace disease. Scoliotic curvature with no displaced fracture. IMPRESSION: Low lung volumes with the hazy appearance of the lower lungs likely related to overlying soft tissues of the chest wall. If there is concern for possible left basilar acute process, a formal PA and lateral chest x-ray may be useful with improved inspiration. Electronically Signed   By: Gilmer Mor D.O.   On: 01/26/2019 09:34    Microbiology: Recent Results (from the past 240 hour(s))  Blood culture (routine x 2)     Status: None (Preliminary result)   Collection Time: 01/26/19  9:32 AM  Result Value Ref Range Status   Specimen Description BLOOD RIGHT ANTECUBITAL  Final   Special Requests   Final    BOTTLES DRAWN AEROBIC AND ANAEROBIC Blood Culture adequate volume   Culture   Final    NO GROWTH 2 DAYS Performed at [facility], [address] [place], [place] [address]    Report Status PENDING  Incomplete  SARS Coronavirus 2 (CEPHEID- Performed in [facility] hospital lab), Hosp Order     Status: None  Collection Time: 01/26/19  9:38 AM  Result Value Ref Range Status   SARS Coronavirus 2 NEGATIVE NEGATIVE Final    Comment: (NOTE) If result is NEGATIVE SARS-CoV-2 target nucleic acids are NOT DETECTED. The SARS-CoV-2 RNA is generally detectable in upper and lower  respiratory specimens during the acute phase of infection. The lowest  concentration of SARS-CoV-2 viral copies this assay can detect is 250  copies / mL. A negative result does not preclude SARS-CoV-2 infection  and should not be used as the sole basis for treatment or other  patient management decisions.  A negative result may occur with  improper specimen collection / handling,  submission of specimen other  than nasopharyngeal swab, presence of viral mutation(s) within the  areas targeted by this assay, and inadequate number of viral copies  (<250 copies / mL). A negative result must be combined with clinical  observations, patient history, and epidemiological information. If result is POSITIVE SARS-CoV-2 target nucleic acids are DETECTED. The SARS-CoV-2 RNA is generally detectable in upper and lower  respiratory specimens dur ing the acute phase of infection.  Positive  results are indicative of active infection with SARS-CoV-2.  Clinical  correlation with patient history and other diagnostic information is  necessary to determine patient infection status.  Positive results do  not rule out bacterial infection or co-infection with other viruses. If result is PRESUMPTIVE POSTIVE SARS-CoV-2 nucleic acids MAY BE PRESENT.   A presumptive positive result was obtained on the submitted specimen  and confirmed on repeat testing.  While 2019 novel coronavirus  (SARS-CoV-2) nucleic acids may be present in the submitted sample  additional confirmatory testing may be necessary for epidemiological  and / or clinical management purposes  to differentiate between  SARS-CoV-2 and other Sarbecovirus currently known to infect humans.  If clinically indicated additional testing with an alternate test  methodology [phone]) is advised. The SARS-CoV-2 RNA is generally  detectable in upper and lower respiratory sp ecimens during the acute  phase of infection. The expected result is Negative. Fact Sheet for Patients:  BoilerBrush.com.cy Fact Sheet for Healthcare Providers: https://pope.com/ This test is not yet approved or cleared by the [place] FDA and has been authorized for detection and/or diagnosis of SARS-CoV-2 by FDA under an Emergency Use Authorization (EUA).  This EUA will remain in effect (meaning this test can be  used) for the duration of the COVID-19 declaration under Section 564(b)(1) of the Act, 21 U.S.C. section 360bbb-3(b)(1), unless the authorization is terminated or revoked sooner. Performed at [facility], [address], [place], [place] [address]   Blood culture (routine x 2)     Status: None (Preliminary result)   Collection Time: 01/26/19  9:52 AM  Result Value Ref Range Status   Specimen Description BLOOD RIGHT HAND  Final   Special Requests   Final    BOTTLES DRAWN AEROBIC AND ANAEROBIC Blood Culture adequate volume   Culture   Final    NO GROWTH 2 DAYS Performed at [facility], [address] [place], [place] [address]    Report Status PENDING  Incomplete  Respiratory Panel by PCR     Status: None   Collection Time: 01/26/19 11:02 PM  Result Value Ref Range Status   Adenovirus NOT DETECTED NOT DETECTED Final   Coronavirus 229E NOT DETECTED NOT DETECTED Final    Comment: (NOTE) The Coronavirus on the Respiratory Panel, DOES NOT test for the novel  Coronavirus (2019 nCoV)  Coronavirus HKU1 NOT DETECTED NOT DETECTED Final   Coronavirus NL63 NOT DETECTED NOT DETECTED Final   Coronavirus OC43 NOT DETECTED NOT DETECTED Final   Metapneumovirus NOT DETECTED NOT DETECTED Final   Rhinovirus / Enterovirus NOT DETECTED NOT DETECTED Final   Influenza A NOT DETECTED NOT DETECTED Final   Influenza B NOT DETECTED NOT DETECTED Final   Parainfluenza Virus 1 NOT DETECTED NOT DETECTED Final   Parainfluenza Virus 2 NOT DETECTED NOT DETECTED Final   Parainfluenza Virus 3 NOT DETECTED NOT DETECTED Final   Parainfluenza Virus 4 NOT DETECTED NOT DETECTED Final   Respiratory Syncytial Virus NOT DETECTED NOT DETECTED Final   Bordetella pertussis NOT DETECTED NOT DETECTED Final   Chlamydophila pneumoniae NOT DETECTED NOT DETECTED Final   Mycoplasma pneumoniae NOT DETECTED NOT DETECTED Final    Comment: Performed at [facility], [address] [place], [place] [address]   Novel Coronavirus, NAA (hospital order; send-out to ref lab)     Status: None   Collection Time: 01/26/19 11:02 PM  Result Value Ref Range Status   SARS-CoV-2, NAA NOT DETECTED NOT DETECTED Final    Comment: (NOTE) Testing was performed using the cobas(R) SARS-CoV-2 test. This test was developed and its performance characteristics determined by [employer]. This test has not been FDA cleared or approved. This test has been authorized by FDA under an Emergency Use Authorization (EUA). This test is only authorized for the duration of time the declaration that circumstances exist justifying the authorization of the emergency use of in vitro diagnostic tests for detection of SARS-CoV-2 virus and/or diagnosis of COVID-19 infection under section 564(b)(1) of the Act, 21 U.S.C. 841YSA-6(T)(0), unless the authorization is terminated or revoked sooner. When diagnostic testing is negative, the possibility of a false negative result should be considered in the context of a patient's recent exposures and the presence of clinical signs and symptoms consistent with COVID-19. An individual without symptoms of COVID-19 and who is not shedding SARS-CoV-2 virus would expect to have  a negative (not detected) result in this assay. Performed At: [facility] [address] [place], [place] [id number] Jolene Schimke MD FT:[id number]    Coronavirus Source NASOPHARYNGEAL  Final    Comment: Performed at [facility], [address] [place], [place] [address]     Labs: CBC: Recent Labs  Lab 01/26/19 0932 01/27/19 0603 01/28/19 0645  WBC 11.2* 12.9* 10.1  NEUTROABS 9.1*  --   --   HGB 13.7 12.2 12.5  HCT 42.0 36.4 37.9  MCV 84.5 82.2 82.8  PLT 345 289 332   Basic Metabolic Panel: Recent Labs  Lab 01/26/19 0932 01/27/19 0603 01/28/19 0645  NA 138 139 137  K 4.0 3.7 3.3*  CL 104 105 103  CO2 24 22 23   GLUCOSE 113* 100* 86  BUN 10 11 13   CREATININE 0.77 0.69 0.75   CALCIUM 9.3 9.1 9.2   Liver Function Tests: Recent Labs  Lab 01/26/19 0932  AST 20  ALT 16  ALKPHOS 46  BILITOT 0.5  PROT 8.1  ALBUMIN 4.1   No results for input(s): LIPASE, AMYLASE in the last 168 hours. No results for input(s): AMMONIA in the last 168 hours. Cardiac Enzymes: No results for input(s): CKTOTAL, CKMB, CKMBINDEX, TROPONINI in the last 168 hours. BNP (last 3 results) No results for input(s): BNP in the last 8760 hours. CBG: No results for input(s): GLUCAP in the last 168 hours. Time spent: 35 minutes  Signed:  Lynden Oxford  [facility] 01/28/2019

## 2019-01-31 LAB — CULTURE, BLOOD (ROUTINE X 2)
Culture: NO GROWTH
Culture: NO GROWTH
Special Requests: ADEQUATE
Special Requests: ADEQUATE

## 2019-02-14 DIAGNOSIS — J4540 Moderate persistent asthma, uncomplicated: Secondary | ICD-10-CM | POA: Diagnosis not present

## 2019-02-14 DIAGNOSIS — Z23 Encounter for immunization: Secondary | ICD-10-CM | POA: Diagnosis not present

## 2019-02-20 DIAGNOSIS — F329 Major depressive disorder, single episode, unspecified: Secondary | ICD-10-CM | POA: Diagnosis not present

## 2019-04-24 DIAGNOSIS — F329 Major depressive disorder, single episode, unspecified: Secondary | ICD-10-CM | POA: Diagnosis not present

## 2019-05-31 DIAGNOSIS — Z20828 Contact with and (suspected) exposure to other viral communicable diseases: Secondary | ICD-10-CM | POA: Diagnosis not present

## 2019-06-21 DIAGNOSIS — F329 Major depressive disorder, single episode, unspecified: Secondary | ICD-10-CM | POA: Diagnosis not present

## 2019-08-29 DIAGNOSIS — J4540 Moderate persistent asthma, uncomplicated: Secondary | ICD-10-CM | POA: Diagnosis not present

## 2019-08-29 DIAGNOSIS — F329 Major depressive disorder, single episode, unspecified: Secondary | ICD-10-CM | POA: Diagnosis not present

## 2019-08-29 DIAGNOSIS — F419 Anxiety disorder, unspecified: Secondary | ICD-10-CM | POA: Diagnosis not present

## 2019-08-29 DIAGNOSIS — F321 Major depressive disorder, single episode, moderate: Secondary | ICD-10-CM | POA: Diagnosis not present

## 2019-09-11 DIAGNOSIS — Z20828 Contact with and (suspected) exposure to other viral communicable diseases: Secondary | ICD-10-CM | POA: Diagnosis not present

## 2019-11-17 ENCOUNTER — Ambulatory Visit: Payer: Self-pay | Attending: Internal Medicine

## 2019-11-17 DIAGNOSIS — Z23 Encounter for immunization: Secondary | ICD-10-CM

## 2019-11-17 NOTE — Progress Notes (Signed)
   Covid-19 Vaccination Clinic  Name:  Samantha Ross    MRN: [id number] DOB: [birth year]  11/17/2019  Ms. Samantha Ross was observed post Covid-19 immunization for 15 minutes without incident. She was provided with Vaccine Information Sheet and instruction to access the V-Safe system.   Ms. Samantha Ross was instructed to call 911 with any severe reactions post vaccine: Marland Kitchen Difficulty breathing  . Swelling of face and throat  . A fast heartbeat  . A bad rash all over body  . Dizziness and weakness   Immunizations Administered    Name Date Dose VIS Date Route   Pfizer COVID-19 Vaccine 11/17/2019 11:00 AM 0.3 mL 08/18/2019 Intramuscular   Manufacturer: [employer], [employer]   Lot: [id number]   NDC: [address]-2

## 2019-12-12 ENCOUNTER — Ambulatory Visit: Payer: Self-pay | Attending: Internal Medicine

## 2019-12-12 DIAGNOSIS — Z23 Encounter for immunization: Secondary | ICD-10-CM

## 2019-12-12 NOTE — Progress Notes (Signed)
   Covid-19 Vaccination Clinic  Name:  Samantha Ross    MRN: [id number] DOB: 12-27-1995  12/12/2019  [name] was observed post Covid-19 immunization for 15 minutes without incident. She was provided with Vaccine Information Sheet and instruction to access the V-Safe system.   [name] was instructed to call 911 with any severe reactions post vaccine: Marland Kitchen Difficulty breathing  . Swelling of face and throat  . A fast heartbeat  . A bad rash all over body  . Dizziness and weakness   Immunizations Administered    Name Date Dose VIS Date Route   Pfizer COVID-19 Vaccine 12/12/2019  9:21 AM 0.3 mL 08/18/2019 Intramuscular   Manufacturer: [employer], [employer]   Lot: [id number]   NDC: [address]-5

## 2020-01-10 ENCOUNTER — Ambulatory Visit: Payer: Self-pay | Attending: Internal Medicine

## 2020-01-10 DIAGNOSIS — Z20822 Contact with and (suspected) exposure to covid-19: Secondary | ICD-10-CM

## 2020-01-11 LAB — SARS-COV-2, NAA 2 DAY TAT

## 2020-01-11 LAB — NOVEL CORONAVIRUS, NAA: SARS-CoV-2, NAA: NOT DETECTED

## 2020-01-22 ENCOUNTER — Ambulatory Visit: Payer: Self-pay | Attending: Internal Medicine

## 2020-01-22 DIAGNOSIS — Z20822 Contact with and (suspected) exposure to covid-19: Secondary | ICD-10-CM

## 2020-01-23 LAB — NOVEL CORONAVIRUS, NAA: SARS-CoV-2, NAA: NOT DETECTED

## 2020-01-23 LAB — SARS-COV-2, NAA 2 DAY TAT

## 2020-03-29 ENCOUNTER — Encounter (HOSPITAL_COMMUNITY): Payer: Self-pay | Admitting: Psychiatry

## 2020-05-22 IMAGING — CR DG CHEST 2V
2 series · 2 of 2 positions shown · non-contrast
Comparison: None.

CLINICAL DATA: Chest pain and shortness of breath

EXAM:
CHEST - 2 VIEW

[w chest pa]
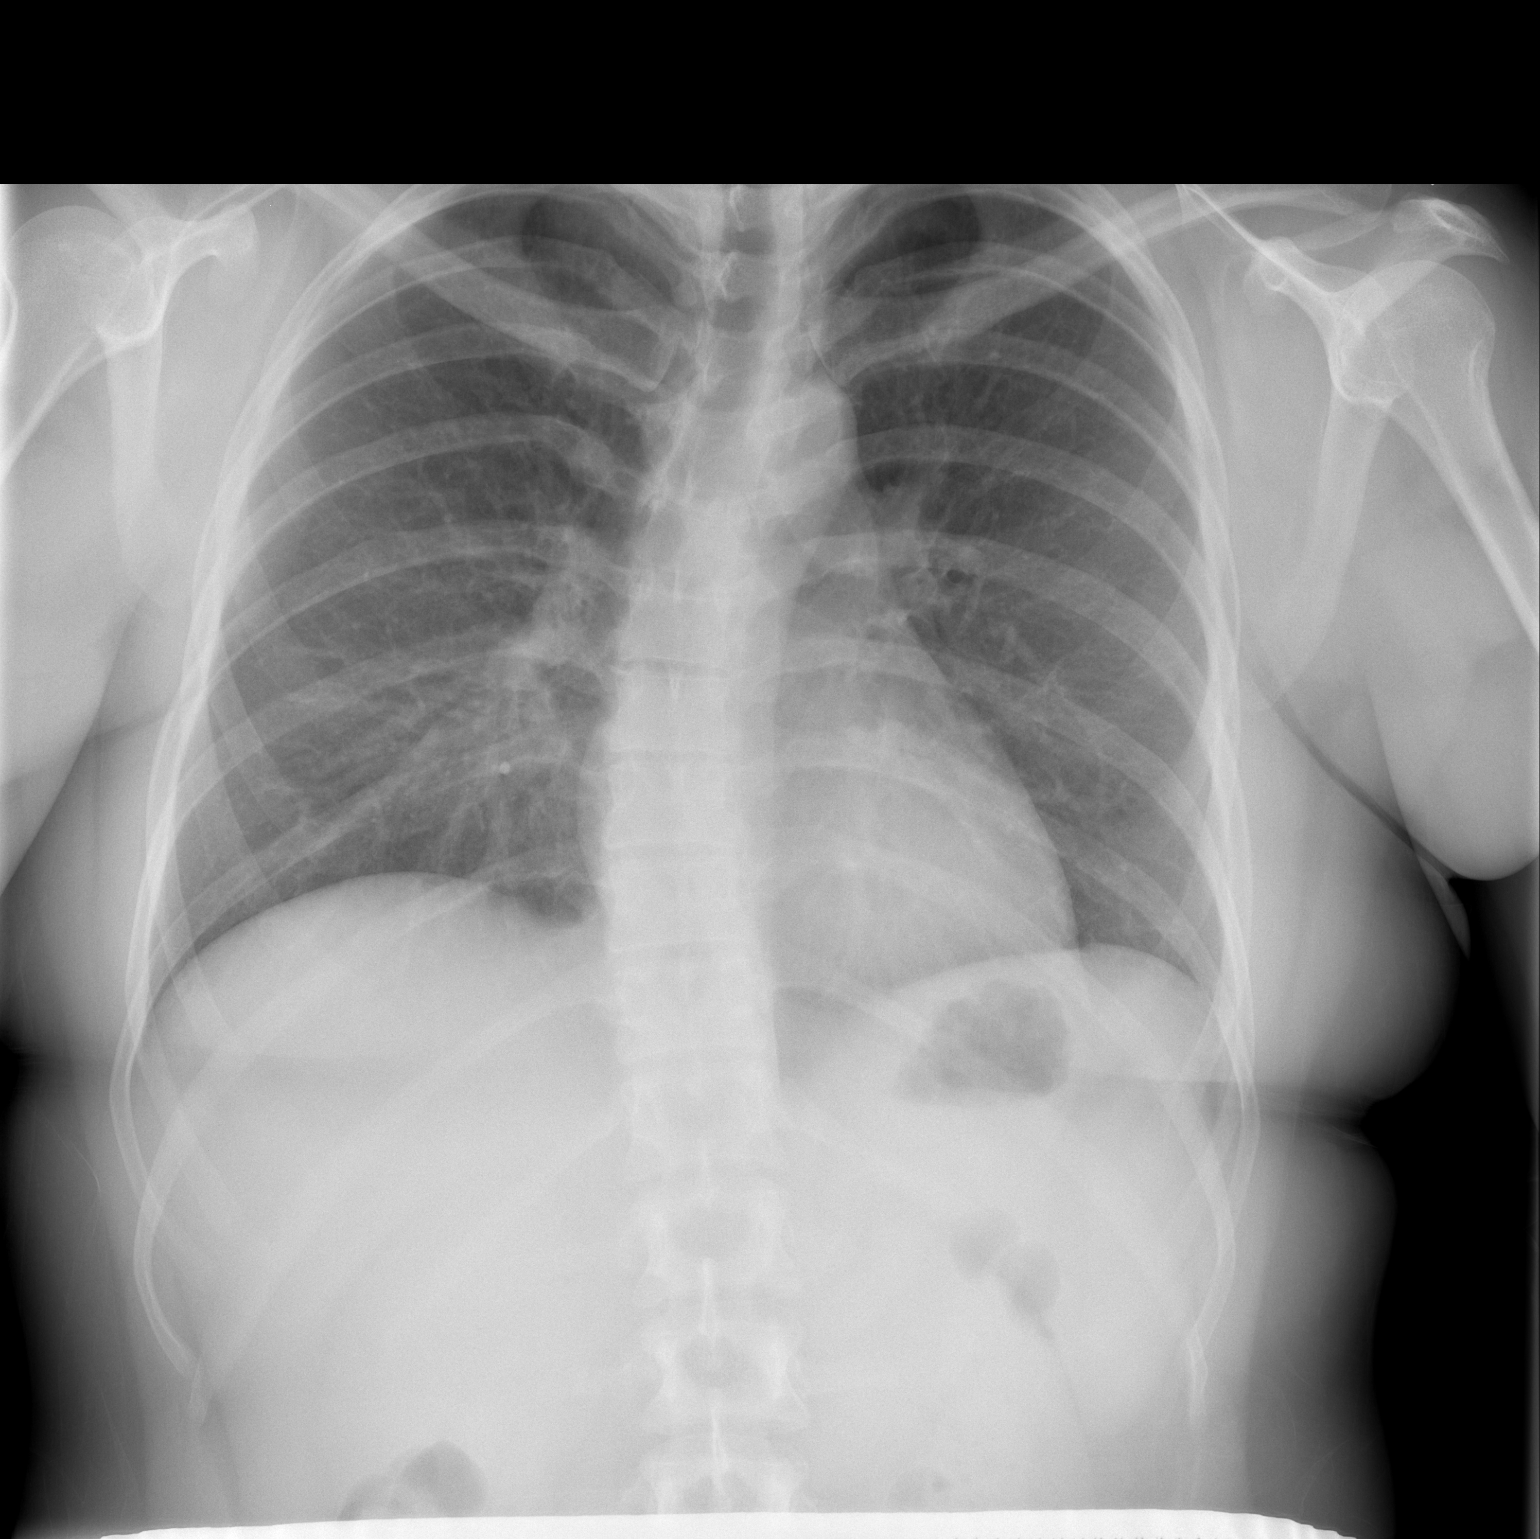

[w chest lat]
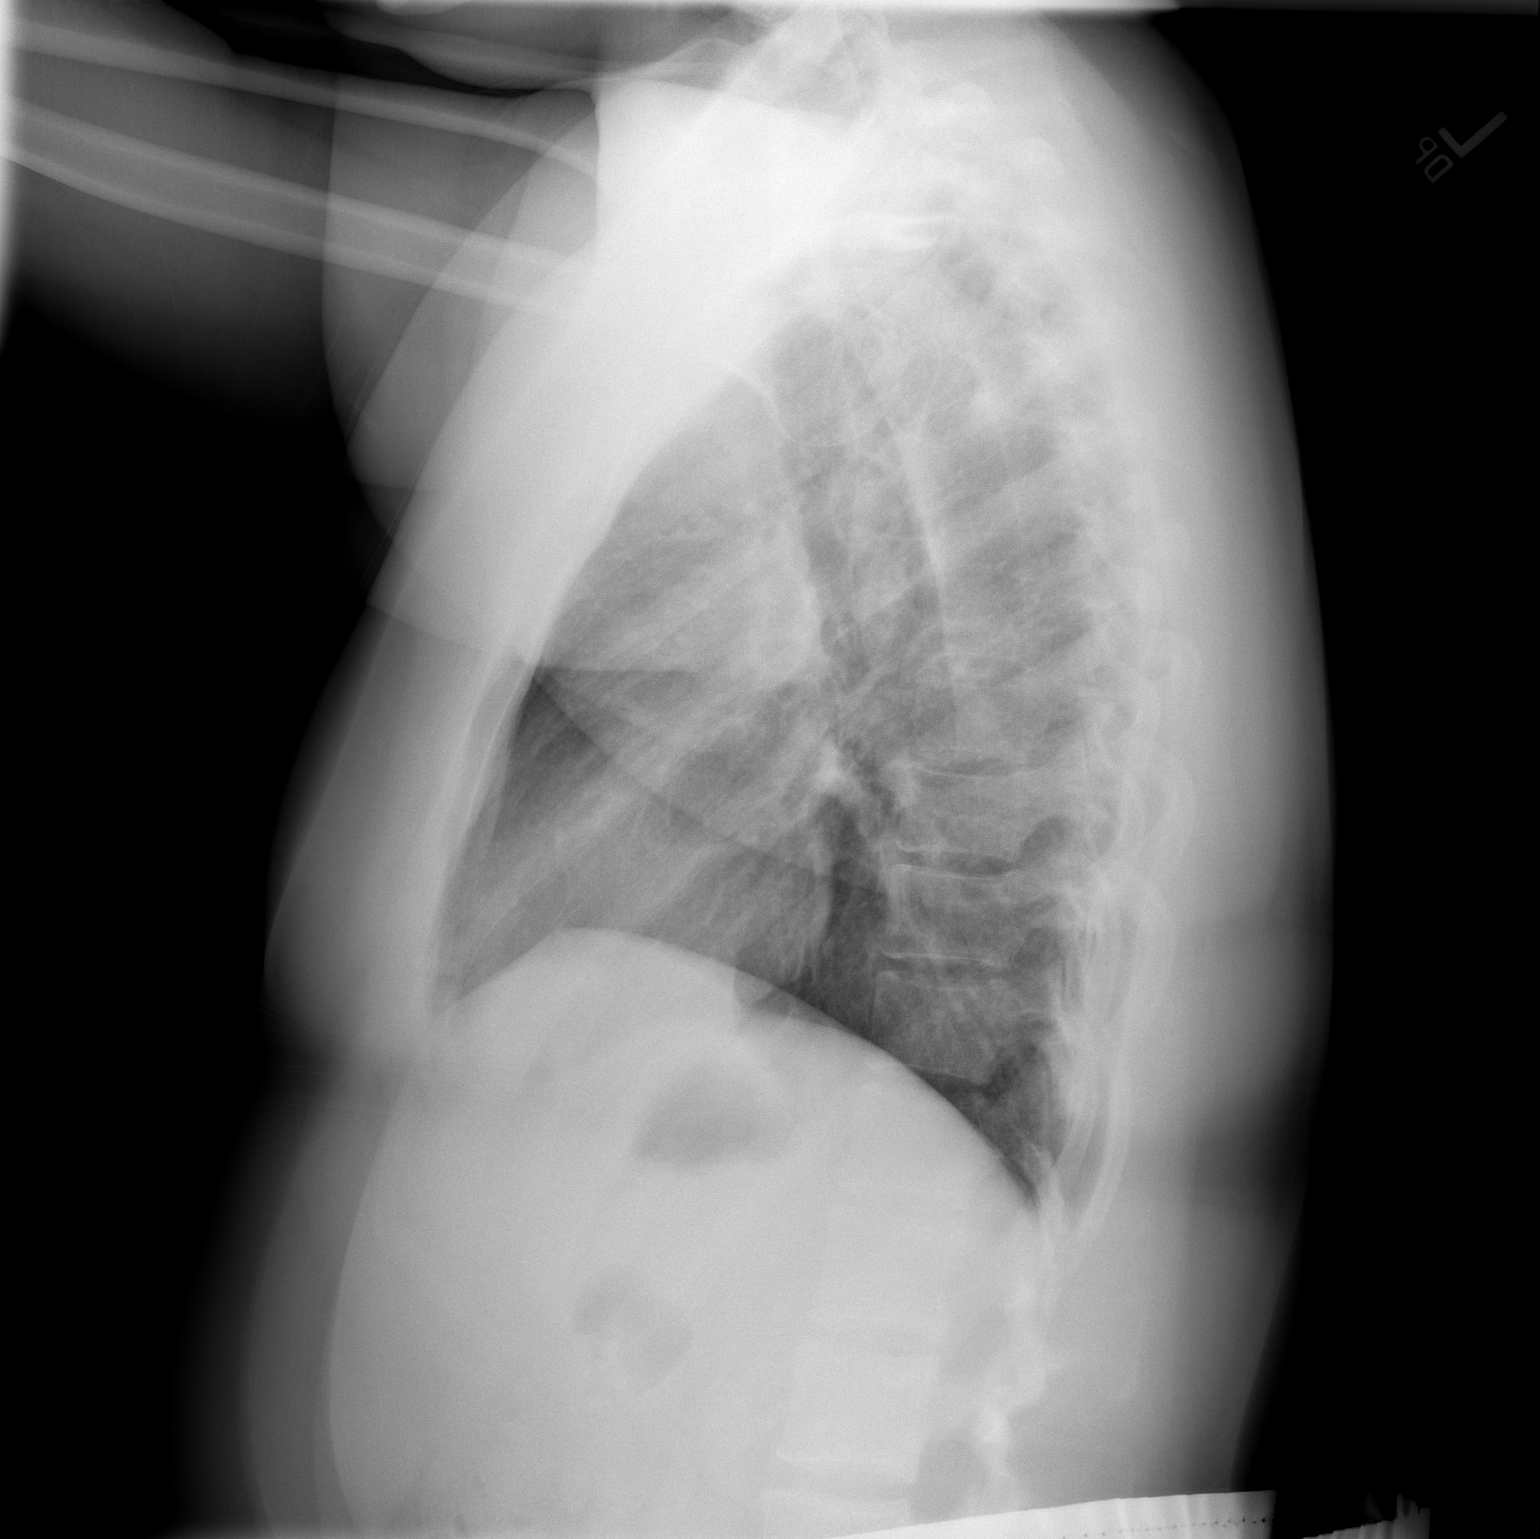

[2 of 2 positions shown; findings below may reference images not displayed]

FINDINGS: Lungs are clear. Heart size and pulmonary vascularity are normal. No
adenopathy. There is midthoracic dextroscoliosis. No pneumothorax.
IMPRESSION: No edema or consolidation.

## 2023-08-13 ENCOUNTER — Other Ambulatory Visit: Payer: Self-pay | Admitting: Family Medicine

## 2023-08-13 ENCOUNTER — Other Ambulatory Visit (HOSPITAL_COMMUNITY)
Admission: RE | Admit: 2023-08-13 | Discharge: 2023-08-13 | Disposition: A | Discharge status: Home / Self Care | Payer: 59 | Source: Ambulatory Visit | Attending: Family Medicine | Admitting: Family Medicine

## 2023-08-13 DIAGNOSIS — Z01411 Encounter for gynecological examination (general) (routine) with abnormal findings: Secondary | ICD-10-CM | POA: Diagnosis present

## 2023-08-18 LAB — CYTOLOGY - PAP
Comment: NEGATIVE
Comment: NEGATIVE
Comment: NEGATIVE
Diagnosis: HIGH — AB
HPV 16: NEGATIVE
HPV 18 / 45: NEGATIVE
High risk HPV: POSITIVE — AB

## 2024-01-16 ENCOUNTER — Encounter (HOSPITAL_BASED_OUTPATIENT_CLINIC_OR_DEPARTMENT_OTHER): Payer: Self-pay

## 2024-01-16 DIAGNOSIS — R1032 Left lower quadrant pain: Secondary | ICD-10-CM | POA: Diagnosis present

## 2024-01-16 DIAGNOSIS — N132 Hydronephrosis with renal and ureteral calculous obstruction: Secondary | ICD-10-CM | POA: Insufficient documentation

## 2024-01-16 NOTE — ED Triage Notes (Signed)
 Pt states that's she has been having L sided flank and abd pain for the past 2 hours with n/v

## 2024-01-17 ENCOUNTER — Emergency Department (HOSPITAL_BASED_OUTPATIENT_CLINIC_OR_DEPARTMENT_OTHER)
Admission: EM | Admit: 2024-01-17 | Discharge: 2024-01-17 | Disposition: A | Discharge status: Home / Self Care | Attending: Emergency Medicine | Admitting: Emergency Medicine

## 2024-01-17 ENCOUNTER — Emergency Department (HOSPITAL_BASED_OUTPATIENT_CLINIC_OR_DEPARTMENT_OTHER)

## 2024-01-17 DIAGNOSIS — N23 Unspecified renal colic: Secondary | ICD-10-CM

## 2024-01-17 LAB — CBC
HCT: 38.7 % (ref 36.0–46.0)
Hemoglobin: 13.1 g/dL (ref 12.0–15.0)
MCH: 28.5 pg (ref 26.0–34.0)
MCHC: 33.9 g/dL (ref 30.0–36.0)
MCV: 84.3 fL (ref 80.0–100.0)
Platelets: 343 10*3/uL (ref 150–400)
RBC: 4.59 MIL/uL (ref 3.87–5.11)
RDW: 13 % (ref 11.5–15.5)
WBC: 9.8 10*3/uL (ref 4.0–10.5)
nRBC: 0 % (ref 0.0–0.2)

## 2024-01-17 LAB — COMPREHENSIVE METABOLIC PANEL WITH GFR
ALT: 24 U/L (ref 0–44)
AST: 22 U/L (ref 15–41)
Albumin: 4.4 g/dL (ref 3.5–5.0)
Alkaline Phosphatase: 55 U/L (ref 38–126)
Anion gap: 14 (ref 5–15)
BUN: 22 mg/dL — ABNORMAL HIGH (ref 6–20)
CO2: 23 mmol/L (ref 22–32)
Calcium: 9.2 mg/dL (ref 8.9–10.3)
Chloride: 101 mmol/L (ref 98–111)
Creatinine, Ser: 0.92 mg/dL (ref 0.44–1.00)
GFR, Estimated: >60 mL/min (ref >60)
Glucose, Bld: 111 mg/dL — ABNORMAL HIGH (ref 70–99)
Potassium: 3.9 mmol/L (ref 3.5–5.1)
Sodium: 138 mmol/L (ref 135–145)
Total Bilirubin: 0.4 mg/dL (ref 0.0–1.2)
Total Protein: 7.3 g/dL (ref 6.5–8.1)

## 2024-01-17 LAB — URINALYSIS, ROUTINE W REFLEX MICROSCOPIC
Bilirubin Urine: NEGATIVE
Glucose, UA: NEGATIVE mg/dL
Ketones, ur: 40 mg/dL — AB
Leukocytes,Ua: NEGATIVE
Nitrite: NEGATIVE
Protein, ur: NEGATIVE mg/dL
Specific Gravity, Urine: 1.025 (ref 1.005–1.030)
pH: 7.5 (ref 5.0–8.0)

## 2024-01-17 LAB — PREGNANCY, URINE: Preg Test, Ur: NEGATIVE

## 2024-01-17 LAB — URINALYSIS, MICROSCOPIC (REFLEX)

## 2024-01-17 LAB — LIPASE, BLOOD: Lipase: 25 U/L (ref 11–51)

## 2024-01-17 MED ORDER — KETOROLAC TROMETHAMINE 30 MG/ML IJ SOLN
15.0000 mg | Freq: Once | INTRAMUSCULAR | Status: AC
  Administered 2024-01-17: 15 mg via INTRAVENOUS
  Filled 2024-01-17: qty 1

## 2024-01-17 MED ORDER — OXYCODONE-ACETAMINOPHEN 5-325 MG PO TABS: 1.0000 | ORAL_TABLET | Freq: Four times a day (QID) | ORAL | 0 refills | Status: AC | PRN

## 2024-01-17 MED ORDER — SODIUM CHLORIDE 0.9 % IV BOLUS
1000.0000 mL | Freq: Once | INTRAVENOUS | Status: AC
  Administered 2024-01-17: 1000 mL via INTRAVENOUS

## 2024-01-17 MED ORDER — ONDANSETRON HCL 4 MG/2ML IJ SOLN
4.0000 mg | Freq: Once | INTRAMUSCULAR | Status: AC
  Administered 2024-01-17: 4 mg via INTRAVENOUS
  Filled 2024-01-17: qty 2

## 2024-01-17 MED ORDER — ONDANSETRON 4 MG PO TBDP: 4.0000 mg | ORAL_TABLET | Freq: Three times a day (TID) | ORAL | 0 refills | Status: AC | PRN

## 2024-01-17 NOTE — Discharge Instructions (Addendum)
 You have a 2 mm stone in your left ureter.  Drink plenty of fluids.  Get rechecked immediately if you have fever, uncontrolled pain or cannot pee.

## 2024-01-17 NOTE — ED Provider Notes (Signed)
 [facility] Provider Note   CSN: [id number] Arrival date & time: 01/16/24  2342     History  Chief Complaint  Patient presents with   Abdominal Pain    Samantha Ross is a 28 y.o. female.  The history is provided by the patient.  Abdominal Pain Samantha Ross is a 28 y.o. female who presents to the Emergency Department complaining of back pain.  She presents to the emergency department for evaluation of left flank pain that started around 11 PM.  Pain is described as sharp and constant and radiates to the left lower quadrant.  She has associated vomiting and dry heaves.  No fever.  No dysuria.  She does have a history of interstitial cystitis and feels like she might be about to get a urinary tract infection.  No prior similar symptoms.      Home Medications Prior to Admission medications   Medication Sig Start Date End Date Taking? Authorizing Provider  ondansetron  (ZOFRAN -ODT) 4 MG disintegrating tablet Take 1 tablet (4 mg total) by mouth every 8 (eight) hours as needed. 01/17/24  Yes Kelsey Patricia, MD  oxyCODONE-acetaminophen  (PERCOCET/ROXICET) 5-325 MG tablet Take 1 tablet by mouth every 6 (six) hours as needed for severe pain (pain score 7-10). 01/17/24  Yes Kelsey Patricia, MD  albuterol  (VENTOLIN  HFA) 108 [phone] Base) MCG/ACT inhaler Inhale 2 puffs into the lungs every 6 (six) hours as needed for wheezing or shortness of breath. 01/28/19   Kraig Peru, MD  benzonatate  (TESSALON ) 100 MG capsule Take 1 capsule (100 mg total) by mouth 3 (three) times daily as needed for cough. 01/28/19   Kraig Peru, MD  hydrOXYzine  (ATARAX /VISTARIL ) 25 MG tablet Take 1 tablet (25 mg total) by mouth every 6 (six) hours as needed for anxiety. 10/18/16   Levester Reagin, NP  predniSONE  (DELTASONE ) 10 MG tablet Take 30mg  daily for 2days,Take 20mg  daily for 2days,Take 10mg  daily for 2days, then stop 01/28/19   Kraig Peru, MD  sertraline   (ZOLOFT ) 100 MG tablet Take 1 tablet (100 mg total) by mouth at bedtime. 10/18/16   Levester Reagin, NP  sertraline  (ZOLOFT ) 100 MG tablet Take 150 mg by mouth daily. 10/06/18   [provider]  traZODone  (DESYREL ) 50 MG tablet Take 1 tablet (50 mg total) by mouth at bedtime as needed for sleep. 10/18/16   Levester Reagin, NP      Allergies    Patient has no known allergies.    Review of Systems   Review of Systems  Gastrointestinal:  Positive for abdominal pain.  All other systems reviewed and are negative.   Physical Exam Updated Vital Signs BP 125/80   Pulse (!) 51   Temp 98.1 F (36.7 C) (Oral)   Resp 16   Ht 5\' 6"  (1.676 m)   Wt 88.5 kg   LMP 12/17/2023   SpO2 100%   BMI 31.47 kg/m  Physical Exam Vitals and nursing note reviewed.  Constitutional:      Appearance: She is well-developed.  HENT:     Head: Normocephalic and atraumatic.  Cardiovascular:     Rate and Rhythm: Normal rate and regular rhythm.  Pulmonary:     Effort: Pulmonary effort is normal. No respiratory distress.  Abdominal:     Palpations: Abdomen is soft.     Tenderness: There is no guarding or rebound.     Comments: Moderate left lower quadrant tenderness  Musculoskeletal:  General: No tenderness.  Skin:    General: Skin is warm and dry.  Neurological:     Mental Status: She is alert and oriented to person, place, and time.  Psychiatric:        Behavior: Behavior normal.     ED Results / Procedures / Treatments   Labs (all labs ordered are listed, but only abnormal results are displayed) Labs Reviewed  COMPREHENSIVE METABOLIC PANEL WITH GFR - Abnormal; Notable for the following components:      Result Value   Glucose, Bld 111 (*)    BUN 22 (*)    All other components within normal limits  URINALYSIS, ROUTINE W REFLEX MICROSCOPIC - Abnormal; Notable for the following components:   APPearance HAZY (*)    Hgb urine dipstick SMALL (*)    Ketones, ur 40 (*)    All other  components within normal limits  URINALYSIS, MICROSCOPIC (REFLEX) - Abnormal; Notable for the following components:   Bacteria, UA FEW (*)    All other components within normal limits  LIPASE, BLOOD  CBC  PREGNANCY, URINE    EKG None  Radiology CT Renal Stone Study Result Date: 01/17/2024 CLINICAL DATA:  Abdominal/flank pain with stone suspected EXAM: CT ABDOMEN AND PELVIS WITHOUT CONTRAST TECHNIQUE: Multidetector CT imaging of the abdomen and pelvis was performed following the standard protocol without IV contrast. RADIATION DOSE REDUCTION: This exam was performed according to the departmental dose-optimization program which includes automated exposure control, adjustment of the mA and/or kV according to patient size and/or use of iterative reconstruction technique. COMPARISON:  None Available. FINDINGS: Lower chest:  No contributory findings. Hepatobiliary: No focal liver abnormality.No evidence of biliary obstruction or stone. Pancreas: Unremarkable. Spleen: Unremarkable. Adrenals/Urinary Tract: Negative adrenals. Mild left hydronephrosis, low-density renal expansion, and perinephric stranding due to a punctate distal left ureteral calculus measuring 2 mm on reformats. No additional nephrolithiasis. Unremarkable bladder. Stomach/Bowel:  No obstruction. No appendicitis. Vascular/Lymphatic: No acute vascular abnormality. No mass or adenopathy. Reproductive:IUD with rotated appearance compared to the longitudinal axis of the uterus, right arm lower than the left. No serosal penetration. Other: No ascites or pneumoperitoneum. Musculoskeletal: No acute abnormalities. 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 IMPRESSION: Mild left hydronephrosis from a punctate distal ureteral stone. Partially rotated IUD relative to the uterus. Electronically Signed   By: Ronnette Coke M.D.   On: 01/17/2024 05:27    Procedures Procedures    Medications Ordered in ED Medications  sodium chloride  0.9 % bolus 1,000 mL  (1,000 mLs Intravenous New Bag/Given 01/17/24 0423)  ketorolac (TORADOL) 30 MG/ML injection 15 mg (15 mg Intravenous Given 01/17/24 0425)  ondansetron  (ZOFRAN ) injection 4 mg (4 mg Intravenous Given 01/17/24 0425)    ED Course/ Medical Decision Making/ A&P                                 Medical Decision Making Amount and/or Complexity of Data Reviewed Labs: ordered. Radiology: ordered.  Risk Prescription drug management.   Patient without significant medical history here for evaluation of flank pain, vomiting.  BUN is mildly elevated, otherwise renal function is within normal limits.  UA is not consistent with UTI.  CT does demonstrate left ureteral stone, no additional complicating features.  She was treated with medications and IV fluids in the emergency department with resolution of her pain.  Discussed with patient home care for renal colic.  Discussed outpatient follow-up as well  as return precautions.        Final Clinical Impression(s) / ED Diagnoses Final diagnoses:  Renal colic on left side    Rx / DC Orders ED Discharge Orders          Ordered    oxyCODONE-acetaminophen  (PERCOCET/ROXICET) 5-325 MG tablet  Every 6 hours PRN        01/17/24 0535    ondansetron  (ZOFRAN -ODT) 4 MG disintegrating tablet  Every 8 hours PRN        01/17/24 0535              Kelsey Patricia, MD 01/17/24 [phone]
